# Patient Record
Sex: Male | Born: 1995 | Race: Black or African American | Hispanic: No | Marital: Single | Smoking: Never smoker
Health system: Southern US, Community
[De-identification: ages and names within clinical notes are randomized; demographics above are authoritative.]

## PROBLEM LIST (undated history)

## (undated) DIAGNOSIS — K56609 Unspecified intestinal obstruction, unspecified as to partial versus complete obstruction: Secondary | ICD-10-CM

---

## 2020-05-12 ENCOUNTER — Other Ambulatory Visit: Payer: Self-pay

## 2020-05-12 ENCOUNTER — Inpatient Hospital Stay (HOSPITAL_COMMUNITY)
Admission: EM | Admit: 2020-05-12 | Discharge: 2020-05-18 | DRG: 329 | Disposition: A | Discharge status: Home / Self Care | Payer: Medicaid Other | Attending: Surgery | Admitting: Surgery

## 2020-05-12 DIAGNOSIS — D62 Acute posthemorrhagic anemia: Secondary | ICD-10-CM | POA: Diagnosis not present

## 2020-05-12 DIAGNOSIS — Z20822 Contact with and (suspected) exposure to covid-19: Secondary | ICD-10-CM | POA: Diagnosis present

## 2020-05-12 DIAGNOSIS — K55049 Acute infarction of large intestine, extent unspecified: Secondary | ICD-10-CM | POA: Diagnosis present

## 2020-05-12 DIAGNOSIS — K562 Volvulus: Principal | ICD-10-CM | POA: Diagnosis present

## 2020-05-12 DIAGNOSIS — D72829 Elevated white blood cell count, unspecified: Secondary | ICD-10-CM | POA: Diagnosis not present

## 2020-05-12 DIAGNOSIS — I96 Gangrene, not elsewhere classified: Secondary | ICD-10-CM | POA: Diagnosis present

## 2020-05-12 DIAGNOSIS — K559 Vascular disorder of intestine, unspecified: Secondary | ICD-10-CM | POA: Diagnosis present

## 2020-05-12 DIAGNOSIS — R509 Fever, unspecified: Secondary | ICD-10-CM

## 2020-05-12 DIAGNOSIS — Z9889 Other specified postprocedural states: Secondary | ICD-10-CM

## 2020-05-12 LAB — CBC
HCT: 50.7 % (ref 39.0–52.0)
Hemoglobin: 17.1 g/dL — ABNORMAL HIGH (ref 13.0–17.0)
MCH: 29.8 pg (ref 26.0–34.0)
MCHC: 33.7 g/dL (ref 30.0–36.0)
MCV: 88.5 fL (ref 80.0–100.0)
Platelets: 209 10*3/uL (ref 150–400)
RBC: 5.73 MIL/uL (ref 4.22–5.81)
RDW: 13.5 % (ref 11.5–15.5)
WBC: 18.7 10*3/uL — ABNORMAL HIGH (ref 4.0–10.5)
nRBC: 0 % (ref 0.0–0.2)

## 2020-05-12 LAB — COMPREHENSIVE METABOLIC PANEL
ALT: 22 U/L (ref 0–44)
AST: 26 U/L (ref 15–41)
Albumin: 4.4 g/dL (ref 3.5–5.0)
Alkaline Phosphatase: 54 U/L (ref 38–126)
Anion gap: 15 (ref 5–15)
BUN: 16 mg/dL (ref 6–20)
CO2: 20 mmol/L — ABNORMAL LOW (ref 22–32)
Calcium: 9.7 mg/dL (ref 8.9–10.3)
Chloride: 101 mmol/L (ref 98–111)
Creatinine, Ser: 1.18 mg/dL (ref 0.61–1.24)
GFR, Estimated: >60 mL/min (ref >60)
Glucose, Bld: 161 mg/dL — ABNORMAL HIGH (ref 70–99)
Potassium: 3.6 mmol/L (ref 3.5–5.1)
Sodium: 136 mmol/L (ref 135–145)
Total Bilirubin: 0.8 mg/dL (ref 0.3–1.2)
Total Protein: 7.7 g/dL (ref 6.5–8.1)

## 2020-05-12 LAB — LIPASE, BLOOD: Lipase: 17 U/L (ref 11–51)

## 2020-05-12 NOTE — ED Triage Notes (Addendum)
Pt c/o abdominal pain that started today. Also c/o N/V, and c/o blood with bowel movement  - states when he wipes only (bright red).

## 2020-05-12 NOTE — ED Notes (Signed)
Pt was lying in the floor in the lobby so the tech brought pt in for reassessment. Pt is A&O x4, continues to rock back and forth and c/o pain. Tech in lobby states pt is hypotensive. Re-evaluating vital signs.

## 2020-05-13 ENCOUNTER — Emergency Department (HOSPITAL_COMMUNITY): Payer: Medicaid Other | Admitting: Registered Nurse

## 2020-05-13 ENCOUNTER — Encounter (HOSPITAL_COMMUNITY): Admission: EM | Disposition: A | Discharge status: Home / Self Care | Payer: Self-pay | Source: Home / Self Care

## 2020-05-13 ENCOUNTER — Emergency Department (HOSPITAL_COMMUNITY): Payer: Medicaid Other

## 2020-05-13 DIAGNOSIS — D72829 Elevated white blood cell count, unspecified: Secondary | ICD-10-CM | POA: Diagnosis not present

## 2020-05-13 DIAGNOSIS — K562 Volvulus: Secondary | ICD-10-CM | POA: Diagnosis present

## 2020-05-13 DIAGNOSIS — K559 Vascular disorder of intestine, unspecified: Secondary | ICD-10-CM | POA: Diagnosis not present

## 2020-05-13 DIAGNOSIS — K55049 Acute infarction of large intestine, extent unspecified: Secondary | ICD-10-CM | POA: Diagnosis present

## 2020-05-13 DIAGNOSIS — Z20822 Contact with and (suspected) exposure to covid-19: Secondary | ICD-10-CM | POA: Diagnosis present

## 2020-05-13 DIAGNOSIS — Z9889 Other specified postprocedural states: Secondary | ICD-10-CM

## 2020-05-13 DIAGNOSIS — I96 Gangrene, not elsewhere classified: Secondary | ICD-10-CM | POA: Diagnosis not present

## 2020-05-13 DIAGNOSIS — D62 Acute posthemorrhagic anemia: Secondary | ICD-10-CM | POA: Diagnosis not present

## 2020-05-13 HISTORY — PX: OSTOMY: SHX5997

## 2020-05-13 HISTORY — PX: LAPAROTOMY: SHX154

## 2020-05-13 HISTORY — PX: COLON RESECTION SIGMOID: SHX6737

## 2020-05-13 LAB — BASIC METABOLIC PANEL
Anion gap: 14 (ref 5–15)
Anion gap: 10 (ref 5–15)
BUN: 18 mg/dL (ref 6–20)
BUN: 19 mg/dL (ref 6–20)
CO2: 15 mmol/L — ABNORMAL LOW (ref 22–32)
CO2: 18 mmol/L — ABNORMAL LOW (ref 22–32)
Calcium: 8.8 mg/dL — ABNORMAL LOW (ref 8.9–10.3)
Calcium: 8.1 mg/dL — ABNORMAL LOW (ref 8.9–10.3)
Chloride: 104 mmol/L (ref 98–111)
Chloride: 106 mmol/L (ref 98–111)
Creatinine, Ser: 1.35 mg/dL — ABNORMAL HIGH (ref 0.61–1.24)
Creatinine, Ser: 1.13 mg/dL (ref 0.61–1.24)
GFR, Estimated: >60 mL/min (ref >60)
GFR, Estimated: >60 mL/min (ref >60)
Glucose, Bld: 161 mg/dL — ABNORMAL HIGH (ref 70–99)
Glucose, Bld: 160 mg/dL — ABNORMAL HIGH (ref 70–99)
Potassium: 6.2 mmol/L — ABNORMAL HIGH (ref 3.5–5.1)
Potassium: 4.5 mmol/L (ref 3.5–5.1)
Sodium: 133 mmol/L — ABNORMAL LOW (ref 135–145)
Sodium: 134 mmol/L — ABNORMAL LOW (ref 135–145)

## 2020-05-13 LAB — CBC
HCT: 50.3 % (ref 39.0–52.0)
HCT: 45.2 % (ref 39.0–52.0)
Hemoglobin: 16.8 g/dL (ref 13.0–17.0)
Hemoglobin: 15.9 g/dL (ref 13.0–17.0)
MCH: 29.5 pg (ref 26.0–34.0)
MCH: 30.5 pg (ref 26.0–34.0)
MCHC: 33.4 g/dL (ref 30.0–36.0)
MCHC: 35.2 g/dL (ref 30.0–36.0)
MCV: 88.4 fL (ref 80.0–100.0)
MCV: 86.6 fL (ref 80.0–100.0)
Platelets: 197 10*3/uL (ref 150–400)
Platelets: 177 10*3/uL (ref 150–400)
RBC: 5.69 MIL/uL (ref 4.22–5.81)
RBC: 5.22 MIL/uL (ref 4.22–5.81)
RDW: 13.7 % (ref 11.5–15.5)
RDW: 13.8 % (ref 11.5–15.5)
WBC: 20.4 10*3/uL — ABNORMAL HIGH (ref 4.0–10.5)
WBC: 18.2 10*3/uL — ABNORMAL HIGH (ref 4.0–10.5)
nRBC: 0 % (ref 0.0–0.2)
nRBC: 0 % (ref 0.0–0.2)

## 2020-05-13 LAB — URINALYSIS, ROUTINE W REFLEX MICROSCOPIC
Glucose, UA: NEGATIVE mg/dL
Hgb urine dipstick: NEGATIVE
Ketones, ur: 5 mg/dL — AB
Nitrite: NEGATIVE
Protein, ur: 30 mg/dL — AB
Specific Gravity, Urine: 1.032 — ABNORMAL HIGH (ref 1.005–1.030)
pH: 5 (ref 5.0–8.0)

## 2020-05-13 LAB — MAGNESIUM
Magnesium: 1.8 mg/dL (ref 1.7–2.4)
Magnesium: 1.6 mg/dL — ABNORMAL LOW (ref 1.7–2.4)

## 2020-05-13 LAB — PHOSPHORUS
Phosphorus: 3.9 mg/dL (ref 2.5–4.6)
Phosphorus: 4.4 mg/dL (ref 2.5–4.6)

## 2020-05-13 LAB — RESP PANEL BY RT-PCR (FLU A&B, COVID) ARPGX2
Influenza A by PCR: NEGATIVE
Influenza B by PCR: NEGATIVE
SARS Coronavirus 2 by RT PCR: NEGATIVE

## 2020-05-13 LAB — HIV ANTIBODY (ROUTINE TESTING W REFLEX): HIV Screen 4th Generation wRfx: NONREACTIVE

## 2020-05-13 SURGERY — OSTOMY
Anesthesia: General | Site: Abdomen

## 2020-05-13 MED ORDER — ENOXAPARIN SODIUM 40 MG/0.4ML ~~LOC~~ SOLN
40.0000 mg | SUBCUTANEOUS | Status: DC
  Administered 2020-05-14 – 2020-05-18 (×5): 40 mg via SUBCUTANEOUS
  Filled 2020-05-13 (×6): qty 0.4

## 2020-05-13 MED ORDER — ACETAMINOPHEN 10 MG/ML IV SOLN
1000.0000 mg | Freq: Four times a day (QID) | INTRAVENOUS | Status: AC
  Administered 2020-05-13 (×3): 1000 mg via INTRAVENOUS
  Filled 2020-05-13 (×4): qty 100

## 2020-05-13 MED ORDER — PHENYLEPHRINE 40 MCG/ML (10ML) SYRINGE FOR IV PUSH (FOR BLOOD PRESSURE SUPPORT)
PREFILLED_SYRINGE | INTRAVENOUS | Status: DC | PRN
  Administered 2020-05-13 (×9): 80 ug via INTRAVENOUS

## 2020-05-13 MED ORDER — KETOROLAC TROMETHAMINE 30 MG/ML IJ SOLN
INTRAMUSCULAR | Status: AC
  Administered 2020-05-13: 30 mg
  Filled 2020-05-13: qty 1

## 2020-05-13 MED ORDER — HYDROMORPHONE HCL 1 MG/ML IJ SOLN
0.5000 mg | Freq: Once | INTRAMUSCULAR | Status: AC
  Administered 2020-05-13: 0.5 mg via INTRAVENOUS
  Filled 2020-05-13: qty 1

## 2020-05-13 MED ORDER — FENTANYL CITRATE (PF) 250 MCG/5ML IJ SOLN
INTRAMUSCULAR | Status: AC
  Filled 2020-05-13: qty 5

## 2020-05-13 MED ORDER — METHOCARBAMOL 1000 MG/10ML IJ SOLN
1000.0000 mg | Freq: Three times a day (TID) | INTRAVENOUS | Status: DC
  Administered 2020-05-13 – 2020-05-18 (×14): 1000 mg via INTRAVENOUS
  Filled 2020-05-13 (×17): qty 10

## 2020-05-13 MED ORDER — MEPERIDINE HCL 25 MG/ML IJ SOLN: 6.2500 mg | INTRAMUSCULAR | Status: DC | PRN

## 2020-05-13 MED ORDER — MIDAZOLAM HCL 2 MG/2ML IJ SOLN
INTRAMUSCULAR | Status: AC
  Filled 2020-05-13: qty 2

## 2020-05-13 MED ORDER — EPHEDRINE SULFATE-NACL 50-0.9 MG/10ML-% IV SOSY
PREFILLED_SYRINGE | INTRAVENOUS | Status: DC | PRN
  Administered 2020-05-13 (×2): 5 mg via INTRAVENOUS

## 2020-05-13 MED ORDER — CEFAZOLIN SODIUM-DEXTROSE 2-3 GM-%(50ML) IV SOLR
INTRAVENOUS | Status: DC | PRN
  Administered 2020-05-13: 2 g via INTRAVENOUS

## 2020-05-13 MED ORDER — POTASSIUM CHLORIDE 10 MEQ/100ML IV SOLN
INTRAVENOUS | Status: AC
  Filled 2020-05-13: qty 200

## 2020-05-13 MED ORDER — SODIUM CHLORIDE 0.9 % IV SOLN
1000.0000 mL | INTRAVENOUS | Status: DC
  Administered 2020-05-13: 1000 mL via INTRAVENOUS

## 2020-05-13 MED ORDER — ACETAMINOPHEN 10 MG/ML IV SOLN
INTRAVENOUS | Status: AC
  Administered 2020-05-13: 1000 mg via INTRAVENOUS
  Filled 2020-05-13: qty 100

## 2020-05-13 MED ORDER — LACTATED RINGERS IV SOLN: INTRAVENOUS | Status: DC | PRN

## 2020-05-13 MED ORDER — DOCUSATE SODIUM 100 MG PO CAPS
100.0000 mg | ORAL_CAPSULE | Freq: Two times a day (BID) | ORAL | Status: DC
  Administered 2020-05-15 – 2020-05-18 (×6): 100 mg via ORAL
  Filled 2020-05-13 (×8): qty 1

## 2020-05-13 MED ORDER — LIDOCAINE 2% (20 MG/ML) 5 ML SYRINGE
INTRAMUSCULAR | Status: DC | PRN
  Administered 2020-05-13: 100 mg via INTRAVENOUS

## 2020-05-13 MED ORDER — MIDAZOLAM HCL 5 MG/5ML IJ SOLN
INTRAMUSCULAR | Status: DC | PRN
  Administered 2020-05-13: 2 mg via INTRAVENOUS

## 2020-05-13 MED ORDER — PIPERACILLIN-TAZOBACTAM 3.375 G IVPB 30 MIN
3.3750 g | Freq: Once | INTRAVENOUS | Status: AC
  Administered 2020-05-13: 3.375 g via INTRAVENOUS

## 2020-05-13 MED ORDER — HYDROMORPHONE HCL 1 MG/ML IJ SOLN
0.2500 mg | INTRAMUSCULAR | Status: DC | PRN
  Administered 2020-05-13: 0.5 mg via INTRAVENOUS

## 2020-05-13 MED ORDER — LACTATED RINGERS IV SOLN: INTRAVENOUS | Status: DC

## 2020-05-13 MED ORDER — POTASSIUM CHLORIDE 10 MEQ/100ML IV SOLN
10.0000 meq | INTRAVENOUS | Status: AC
  Administered 2020-05-13 (×4): 10 meq via INTRAVENOUS
  Filled 2020-05-13: qty 100

## 2020-05-13 MED ORDER — ONDANSETRON HCL 4 MG/2ML IJ SOLN
INTRAMUSCULAR | Status: DC | PRN
  Administered 2020-05-13: 4 mg via INTRAVENOUS

## 2020-05-13 MED ORDER — PHENYLEPHRINE HCL-NACL 10-0.9 MG/250ML-% IV SOLN
INTRAVENOUS | Status: DC | PRN
  Administered 2020-05-13: 25 ug/min via INTRAVENOUS

## 2020-05-13 MED ORDER — ONDANSETRON HCL 4 MG/2ML IJ SOLN
4.0000 mg | Freq: Once | INTRAMUSCULAR | Status: AC
  Administered 2020-05-13: 4 mg via INTRAVENOUS
  Filled 2020-05-13: qty 2

## 2020-05-13 MED ORDER — ONDANSETRON 4 MG PO TBDP: 4.0000 mg | ORAL_TABLET | Freq: Four times a day (QID) | ORAL | Status: DC | PRN

## 2020-05-13 MED ORDER — SODIUM CHLORIDE 0.9 % IV BOLUS (SEPSIS)
1000.0000 mL | Freq: Once | INTRAVENOUS | Status: AC
  Administered 2020-05-13: 1000 mL via INTRAVENOUS

## 2020-05-13 MED ORDER — PROPOFOL 10 MG/ML IV BOLUS
INTRAVENOUS | Status: AC
  Filled 2020-05-13: qty 20

## 2020-05-13 MED ORDER — PIPERACILLIN-TAZOBACTAM 3.375 G IVPB 30 MIN
3.3750 g | Freq: Three times a day (TID) | INTRAVENOUS | Status: AC
  Administered 2020-05-13 (×2): 3.375 g via INTRAVENOUS
  Filled 2020-05-13 (×4): qty 50

## 2020-05-13 MED ORDER — ONDANSETRON HCL 4 MG/2ML IJ SOLN: 4.0000 mg | Freq: Once | INTRAMUSCULAR | Status: DC | PRN

## 2020-05-13 MED ORDER — SUGAMMADEX SODIUM 200 MG/2ML IV SOLN
INTRAVENOUS | Status: DC | PRN
  Administered 2020-05-13: 200 mg via INTRAVENOUS

## 2020-05-13 MED ORDER — FENTANYL CITRATE (PF) 250 MCG/5ML IJ SOLN
INTRAMUSCULAR | Status: DC | PRN
  Administered 2020-05-13: 250 ug via INTRAVENOUS
  Administered 2020-05-13 (×2): 25 ug via INTRAVENOUS

## 2020-05-13 MED ORDER — 0.9 % SODIUM CHLORIDE (POUR BTL) OPTIME
TOPICAL | Status: DC | PRN
  Administered 2020-05-13: 5000 mL

## 2020-05-13 MED ORDER — POTASSIUM CHLORIDE 10 MEQ/100ML IV SOLN
INTRAVENOUS | Status: AC
  Filled 2020-05-13: qty 100

## 2020-05-13 MED ORDER — MORPHINE SULFATE (PF) 2 MG/ML IV SOLN: 2.0000 mg | INTRAVENOUS | Status: DC | PRN

## 2020-05-13 MED ORDER — IOHEXOL 300 MG/ML  SOLN
100.0000 mL | Freq: Once | INTRAMUSCULAR | Status: AC | PRN
  Administered 2020-05-13: 100 mL via INTRAVENOUS

## 2020-05-13 MED ORDER — HYDROMORPHONE HCL 1 MG/ML IJ SOLN
INTRAMUSCULAR | Status: AC
  Filled 2020-05-13: qty 1

## 2020-05-13 MED ORDER — ONDANSETRON HCL 4 MG/2ML IJ SOLN
4.0000 mg | Freq: Four times a day (QID) | INTRAMUSCULAR | Status: DC | PRN
  Administered 2020-05-13: 4 mg via INTRAVENOUS
  Filled 2020-05-13: qty 2

## 2020-05-13 MED ORDER — OXYCODONE HCL 5 MG/5ML PO SOLN
5.0000 mg | ORAL | Status: DC | PRN
  Administered 2020-05-16: 5 mg via ORAL
  Administered 2020-05-17: 10 mg via ORAL
  Filled 2020-05-13: qty 5
  Filled 2020-05-13: qty 10

## 2020-05-13 MED ORDER — KETOROLAC TROMETHAMINE 15 MG/ML IJ SOLN
30.0000 mg | Freq: Four times a day (QID) | INTRAMUSCULAR | Status: AC
  Administered 2020-05-13 – 2020-05-18 (×19): 30 mg via INTRAVENOUS
  Filled 2020-05-13 (×19): qty 2

## 2020-05-13 MED ORDER — PROPOFOL 10 MG/ML IV BOLUS
INTRAVENOUS | Status: DC | PRN
  Administered 2020-05-13: 200 mg via INTRAVENOUS

## 2020-05-13 MED ORDER — SUCCINYLCHOLINE CHLORIDE 200 MG/10ML IV SOSY
PREFILLED_SYRINGE | INTRAVENOUS | Status: DC | PRN
  Administered 2020-05-13: 140 mg via INTRAVENOUS

## 2020-05-13 MED ORDER — ROCURONIUM BROMIDE 10 MG/ML (PF) SYRINGE
PREFILLED_SYRINGE | INTRAVENOUS | Status: DC | PRN
  Administered 2020-05-13: 60 mg via INTRAVENOUS

## 2020-05-13 SURGICAL SUPPLY — 52 items
BLADE CLIPPER SURG (BLADE) IMPLANT
CANISTER SUCT 3000ML PPV (MISCELLANEOUS) ×2 IMPLANT
CHLORAPREP W/TINT 26 (MISCELLANEOUS) ×2 IMPLANT
COVER SURGICAL LIGHT HANDLE (MISCELLANEOUS) ×2 IMPLANT
DRAPE LAPAROSCOPIC ABDOMINAL (DRAPES) ×2 IMPLANT
DRAPE UNIVERSAL (DRAPES) ×2 IMPLANT
DRAPE WARM FLUID 44X44 (DRAPES) ×2 IMPLANT
DRSG OPSITE 11X17.75 LRG (GAUZE/BANDAGES/DRESSINGS) ×2 IMPLANT
DRSG OPSITE POSTOP 4X10 (GAUZE/BANDAGES/DRESSINGS) ×2 IMPLANT
DRSG OPSITE POSTOP 4X8 (GAUZE/BANDAGES/DRESSINGS) ×2 IMPLANT
ELECT BLADE 6.5 EXT (BLADE) IMPLANT
ELECT CAUTERY BLADE 6.4 (BLADE) ×2 IMPLANT
ELECT REM PT RETURN 9FT ADLT (ELECTROSURGICAL) ×2
ELECTRODE REM PT RTRN 9FT ADLT (ELECTROSURGICAL) ×1 IMPLANT
GAUZE SPONGE 4X4 12PLY STRL LF (GAUZE/BANDAGES/DRESSINGS) ×2 IMPLANT
GLOVE BIO SURGEON STRL SZ 6.5 (GLOVE) ×4 IMPLANT
GLOVE BIOGEL PI IND STRL 6 (GLOVE) ×2 IMPLANT
GLOVE BIOGEL PI IND STRL 7.0 (GLOVE) ×2 IMPLANT
GLOVE BIOGEL PI IND STRL 7.5 (GLOVE) ×2 IMPLANT
GLOVE BIOGEL PI INDICATOR 6 (GLOVE) ×2
GLOVE BIOGEL PI INDICATOR 7.0 (GLOVE) ×2
GLOVE BIOGEL PI INDICATOR 7.5 (GLOVE) ×2
GOWN STRL REUS W/ TWL LRG LVL3 (GOWN DISPOSABLE) ×2 IMPLANT
GOWN STRL REUS W/TWL LRG LVL3 (GOWN DISPOSABLE) ×4
HANDLE SUCTION POOLE (INSTRUMENTS) ×1 IMPLANT
KIT BASIN OR (CUSTOM PROCEDURE TRAY) ×2 IMPLANT
KIT OSTOMY DRAINABLE 2.75 STR (WOUND CARE) ×2 IMPLANT
KIT TURNOVER KIT B (KITS) ×2 IMPLANT
LIGASURE IMPACT 36 18CM CVD LR (INSTRUMENTS) ×2 IMPLANT
NS IRRIG 1000ML POUR BTL (IV SOLUTION) ×10 IMPLANT
PACK GENERAL/GYN (CUSTOM PROCEDURE TRAY) ×2 IMPLANT
PAD ABD 8X10 STRL (GAUZE/BANDAGES/DRESSINGS) ×2 IMPLANT
PAD ARMBOARD 7.5X6 YLW CONV (MISCELLANEOUS) ×2 IMPLANT
PENCIL SMOKE EVACUATOR (MISCELLANEOUS) ×2 IMPLANT
RELOAD PROXIMATE 75MM BLUE (ENDOMECHANICALS) ×4 IMPLANT
SPONGE LAP 18X18 RF (DISPOSABLE) ×4 IMPLANT
STAPLER PROXIMATE 75MM BLUE (STAPLE) ×2 IMPLANT
STAPLER VISISTAT 35W (STAPLE) ×2 IMPLANT
SUCTION POOLE HANDLE (INSTRUMENTS) ×2
SUT PDS AB 1 TP1 54 (SUTURE) ×4 IMPLANT
SUT PDS AB 1 TP1 96 (SUTURE) ×4 IMPLANT
SUT PROLENE 2 0 SH 30 (SUTURE) ×2 IMPLANT
SUT SILK 2 0 SH CR/8 (SUTURE) ×2 IMPLANT
SUT SILK 2 0 TIES 10X30 (SUTURE) ×2 IMPLANT
SUT SILK 3 0 SH CR/8 (SUTURE) ×2 IMPLANT
SUT SILK 3 0 TIES 10X30 (SUTURE) ×2 IMPLANT
SUT VIC AB 2-0 SH 18 (SUTURE) ×6 IMPLANT
SUT VIC AB 3-0 SH 18 (SUTURE) ×2 IMPLANT
TAPE CLOTH SURG 4X10 WHT LF (GAUZE/BANDAGES/DRESSINGS) ×2 IMPLANT
TOWEL GREEN STERILE (TOWEL DISPOSABLE) ×2 IMPLANT
TRAY FOLEY MTR SLVR 16FR STAT (SET/KITS/TRAYS/PACK) ×2 IMPLANT
YANKAUER SUCT BULB TIP NO VENT (SUCTIONS) IMPLANT

## 2020-05-13 NOTE — Evaluation (Signed)
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 Pt underwent emergent ex-lap, bowel resection, and ostomy. PMH: pt and family refugee from [place], have been here a month.  Clinical Impression  Pt admitted with above. Pt very pleasant and denies pain, however per RN pt has been given pain medicine t/o day as ordered. Pt functioning at min guard with exception of assist with trunk elevation to transfer to EOB. Pt with good home set up and support. Pt did become diaphoretic with tachycardia, HR into 120s during ambulation. Acute PT to follow/monitor while pt in hospital.     Follow Up Recommendations No PT follow up;Supervision - Intermittent    Equipment Recommendations  None recommended by PT    Recommendations for Other Services       Precautions / Restrictions Precautions Precautions: Other (comment) Precaution Comments: ostomy, abdominal incision Restrictions Weight Bearing Restrictions: No      Mobility  Bed Mobility Overal bed mobility: Needs Assistance Bed Mobility: Rolling;Sidelying to Sit Rolling: Min guard Sidelying to sit: Min assist       General bed mobility comments: max directional verbal cues for log roll technique, min A for trunk elevation, HOB elevated    Transfers Overall transfer level: Needs assistance Equipment used: None Transfers: Sit to/from Stand Sit to Stand: Min guard         General transfer comment: increased time, slow and guarded but steady and safe  Ambulation/Gait Ambulation/Gait assistance: Min guard Gait Distance (Feet): 150 Feet Assistive device: None Gait Pattern/deviations: Step-through pattern;Decreased stride length Gait velocity: dec compared to normal Gait velocity interpretation: 1.31 - 2.62 ft/sec, indicative of limited  community ambulator General Gait Details: no episode of LOB, slow and guarded but steady, able to stand up tall and straight  Careers information officer    Modified Rankin (Stroke Patients Only)       Balance Overall balance assessment: Mild deficits observed, not formally tested                                           Pertinent Vitals/Pain Pain Assessment: No/denies pain    Home Living Family/patient expects to be discharged to:: Private residence Living Arrangements: Parent;Children Available Help at Discharge: Family;Available 24 hours/day Type of Home: House Home Access: Level entry     Home Layout: One level Home Equipment: None Additional Comments: pt, parents and 6 siblings are in refugee program from [place]. Have been here for 1 month. Live in duplex - 1 for boys and 1 for girls.    Prior Function Level of Independence: Independent               Hand Dominance   Dominant Hand: Right    Extremity/Trunk Assessment   Upper Extremity Assessment Upper Extremity Assessment: Overall WFL for tasks assessed    Lower Extremity Assessment Lower Extremity Assessment: Overall WFL for tasks assessed    Cervical / Trunk Assessment Cervical / Trunk Assessment: Other exceptions Cervical / Trunk Exceptions: recent exlap and ostomy  Communication   Communication: Prefers language other than English (pt speaks arabic)  Cognition Arousal/Alertness: Awake/alert Behavior During Therapy: WFL for tasks assessed/performed Overall Cognitive Status:  Within Functional Limits for tasks assessed                                        General Comments General comments (skin integrity, edema, etc.): upon PT arrival pt with bowel movement. Pt able to sit at EOB and don socks via bringing ankle up to knee, increased time due to abdominal discomfort. Pt stood x 4 min and assisted with pericare. No episode of LOB    Exercises      Assessment/Plan    PT Assessment Patient needs continued PT services  PT Problem List Decreased strength;Decreased range of motion;Decreased activity tolerance;Decreased mobility;Decreased balance;Decreased coordination;Decreased cognition;Decreased knowledge of use of DME;Decreased safety awareness       PT Treatment Interventions DME instruction;Gait training;Stair training;Functional mobility training;Therapeutic activities;Therapeutic exercise;Balance training    PT Goals (Current goals can be found in the Care Plan section)  Acute Rehab PT Goals PT Goal Formulation: With patient Time For Goal Achievement: 05/26/20 Potential to Achieve Goals: Good    Frequency Min 3X/week   Barriers to discharge        Co-evaluation               AM-PAC PT "6 Clicks" Mobility  Outcome Measure Help needed turning from your back to your side while in a flat bed without using bedrails?: A Little Help needed moving from lying on your back to sitting on the side of a flat bed without using bedrails?: A Little Help needed moving to and from a bed to a chair (including a wheelchair)?: A Little Help needed standing up from a chair using your arms (e.g., wheelchair or bedside chair)?: A Little Help needed to walk in hospital room?: A Little Help needed climbing 3-5 steps with a railing? : A Little 6 Click Score: 18    End of Session   Activity Tolerance: Patient tolerated treatment well Patient left: in chair;with call bell/phone within reach;with nursing/sitter in room Nurse Communication: Mobility status PT Visit Diagnosis: Unsteadiness on feet (R26.81)    Time: 8676-7209 PT Time Calculation (min) (ACUTE ONLY): 27 min   Charges:   PT Evaluation $PT Eval Moderate Complexity: 1 Mod PT Treatments $Gait Training: 8-22 mins        Lewis Shock, PT, DPT Acute Rehabilitation Services Pager #: [phone] Office #: [phone]   Iona Hansen 05/13/2020, 2:01 PM

## 2020-05-13 NOTE — ED Notes (Signed)
Pt to go to OR2; advised not to put in NGT before transport; pt transported via stretcher; report given to OR staff at bedside.

## 2020-05-13 NOTE — Progress Notes (Signed)
OT Cancellation Note  Patient Details Name: Todd Jordan MRN: [id number] DOB: 03/27/1996   Cancelled Treatment:    Reason Eval/Treat Not Completed: Fatigue/lethargy limiting ability to participate; pt sleeping soundly upon OT arrival. Will attempt to follow up for OT eval as schedule permits.  Marcy Siren, OT Acute Rehabilitation Services Pager [phone] Office [phone]   Todd Jordan 05/13/2020, 4:36 PM
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
## 2020-05-13 NOTE — Anesthesia Postprocedure Evaluation (Signed)
Anesthesia Post Note  Patient: Todd Jordan  Procedure(s) Performed: EXPLORATORY LAPAROTOMY MOTION OF SPLENIC FLEXTION (N/A Abdomen) OSTOMY (N/A Abdomen) COLON RESECTION SIGMOID (N/A Abdomen)     Patient location during evaluation: PACU Anesthesia Type: General Level of consciousness: awake and alert Pain management: pain level controlled Vital Signs Assessment: post-procedure vital signs reviewed and stable Respiratory status: spontaneous breathing, nonlabored ventilation, respiratory function stable and patient connected to nasal cannula oxygen Cardiovascular status: blood pressure returned to baseline and stable Postop Assessment: no apparent nausea or vomiting Anesthetic complications: no   No complications documented.  Last Vitals:  Vitals:   05/13/20 0420 05/13/20 0435  BP: (!) 158/102 (!) 163/101  Pulse:    Resp: 15 14  Temp: 36.4 C   SpO2: 97%     Last Pain:  Vitals:   05/13/20 0435  TempSrc:   PainSc: 3                  Latha Staunton DAVID

## 2020-05-13 NOTE — Anesthesia Preprocedure Evaluation (Signed)
Anesthesia Evaluation  Patient identified by MRN, date of birth, ID band Patient awake    Reviewed: Allergy & Precautions, NPO status , Patient's Chart, lab work & pertinent test results  Airway Mallampati: I  TM Distance: >3 FB Neck ROM: Full    Dental   Pulmonary    Pulmonary exam normal        Cardiovascular Normal cardiovascular exam     Neuro/Psych    GI/Hepatic   Endo/Other    Renal/GU      Musculoskeletal   Abdominal   Peds  Hematology   Anesthesia Other Findings   Reproductive/Obstetrics                             Anesthesia Physical Anesthesia Plan  ASA: III and emergent  Anesthesia Plan: General   Post-op Pain Management:    Induction: Intravenous, Rapid sequence and Cricoid pressure planned  PONV Risk Score and Plan: 2 and Ondansetron and Midazolam  Airway Management Planned: Oral ETT  Additional Equipment:   Intra-op Plan:   Post-operative Plan: Possible Post-op intubation/ventilation  Informed Consent: I have reviewed the patients History and Physical, chart, labs and discussed the procedure including the risks, benefits and alternatives for the proposed anesthesia with the patient or authorized representative who has indicated his/her understanding and acceptance.       Plan Discussed with: CRNA and Surgeon  Anesthesia Plan Comments:         Anesthesia Quick Evaluation

## 2020-05-13 NOTE — Progress Notes (Signed)
Patient is diaphoretic. Patient complaining of dizziness and nausea. Zofran given. Vital signs within normal limits except heart rate sustaining in the 120s. Dr. Fredricka Bonine made aware and instructed RN to continue to monitor patient.    .BP 125/79 (BP Location: Left Arm)   Pulse (!) 105   Temp 99.7 F (37.6 C) (Oral)   Resp 16   Ht 6' 1.62" (1.87 m)   Wt 74.8 kg   SpO2 97%   BMI 21.39 kg/m

## 2020-05-13 NOTE — Consult Note (Signed)
WOC Nurse ostomy follow up Patient receiving care in [facility] 4NP5.  Teleinterpreter Asmaa, Operator [phone], used throughout the encounter. Stoma type/location: LUQ colostomy Stomal assessment/size: 1 inch round, slightly budded, red, moist Peristomal assessment: deferred. The ostomy appliance is placed over the surgical dressing Treatment options for stomal/peristomal skin: barrier ring if needed Output: drops of serosanginous in pouch  Ostomy pouching: 2pc. Placed in OR Education provided: Via interpreter, I explained the WOC role to the patient's father.  And, that I will return tomorrow with teaching materials in [place].  The father tells me he can read some English and can translate words he does not recognize.  The patient remained asleep during my conversation with the father. Enrolled patient in [place] Secure Start Discharge program: No Supplies ordered: [employer]in #[id number]; barrier ring, [facility] [place] [phone]. Helmut Muster, RN, MSN, CWOCN, CNS-BC, pager [phone]

## 2020-05-13 NOTE — Progress Notes (Signed)
Received report from [place] and pt was transported to floor.  Patient was accompanied by father and translator was used to explain rules and policy of the floor.  Patient was AxO=4 and reporting no pain at this time.  Patient had NG tube hooked up to low intermittent suction.  Call bell was placed and bed left in lowest position.

## 2020-05-13 NOTE — Progress Notes (Signed)
   05/13/20 1610  Assess: MEWS Score  Temp 97.9 F (36.6 C)  BP 121/85  Pulse Rate (!) 114  ECG Heart Rate (!) 118  Resp 15  SpO2 98 %  O2 Device Room Air  Assess: MEWS Score  MEWS Temp 0  MEWS Systolic 0  MEWS Pulse 2  MEWS RR 0  MEWS LOC 0  MEWS Score 2  MEWS Score Color Yellow  Assess: if the MEWS score is Yellow or Red  Were vital signs taken at a resting state? Yes  Focused Assessment No change from prior assessment  Early Detection of Sepsis Score *See Row Information* Low  MEWS guidelines implemented *See Row Information* Yes  Treat  Pain Scale PAINAD  Pain Score 0  Take Vital Signs  Increase Vital Sign Frequency  Yellow: Q 2hr X 2 then Q 4hr X 2, if remains yellow, continue Q 4hrs  Escalate  MEWS: Escalate Yellow: discuss with charge nurse/RN and consider discussing with provider and RRT  Notify: Charge Nurse/RN  Name of Charge Nurse/RN Notified Moriah, RN  Date Charge Nurse/RN Notified 05/13/20  Time Charge Nurse/RN Notified [phone]  Document  Patient Outcome Other (Comment) (no intervention needed)

## 2020-05-13 NOTE — Transfer of Care (Signed)
Immediate Anesthesia Transfer of Care Note  Patient: Todd Jordan  Procedure(s) Performed: EXPLORATORY LAPAROTOMY MOTION OF SPLENIC FLEXTION (N/A Abdomen) OSTOMY (N/A Abdomen) COLON RESECTION SIGMOID (N/A Abdomen)  Patient Location: PACU  Anesthesia Type:General  Level of Consciousness: drowsy and patient cooperative  Airway & Oxygen Therapy: Patient Spontanous Breathing and Patient connected to face mask oxygen  Post-op Assessment: Report given to RN and Post -op Vital signs reviewed and stable  Post vital signs: Reviewed and stable  Last Vitals:  Vitals Value Taken Time  BP 157/86 05/13/20 0422  Temp    Pulse 109   Resp 16 05/13/20 0422  SpO2 100   Vitals shown include unvalidated device data.  Last Pain:  Vitals:   05/12/20 2242  TempSrc: Oral  PainSc: 10-Worst pain ever         Complications: No complications documented.

## 2020-05-13 NOTE — ED Provider Notes (Addendum)
[facility] EMERGENCY DEPARTMENT Provider Note  CSN: [id number] Arrival date & time: 05/12/20 2020  Chief Complaint(s) Abdominal Pain  HPI Todd Jordan is a 24 y.o. male   HPI The history is limited by a language barrier. A language interpreter was used.  Abdominal Pain Pain location:  Periumbilical and suprapubic Pain quality: aching   Pain radiates to:  Does not radiate Pain severity:  Severe Onset quality:  Sudden Duration: several hours. Timing:  Constant Progression:  Unchanged Chronicity:  New Context: not eating, not sick contacts and not suspicious food intake   Relieved by:  Nothing Worsened by:  Nothing Associated symptoms: diarrhea, hematochezia, nausea and vomiting   Associated symptoms: no chest pain, no chills, no constipation, no cough, no dysuria, no fatigue, no fever, no hematemesis and no shortness of breath  Patient recently arrived from [place]. No PHx. No prior surgeries, but has had superficial abd wall lacerations from an accident. Not currently on any medication. No known allergies.  Past Medical History No past medical history on file. There are no problems to display for this patient.  Home Medication(s) Prior to Admission medications   Medication Sig Start Date End Date Taking? Authorizing Provider  diphenhydrAMINE (BENADRYL) 25 MG tablet Take 25 mg by mouth every 6 (six) hours as needed for itching.   Yes [provider]                                                                                                                                    Past Surgical History ** The histories are not reviewed yet. Please review them in the "History" navigator section and refresh this SmartLink. Family History No family history on file.  Social History   Allergies Patient has no known allergies.  Review of Systems Review of Systems Constitutional: Negative for chills, fatigue and fever.  Respiratory: Negative for  cough and shortness of breath.   Cardiovascular: Negative for chest pain.  Gastrointestinal: Positive for abdominal pain, diarrhea, hematochezia, nausea and vomiting. Negative for constipation and hematemesis.  Genitourinary: Negative for dysuria.   All other systems are reviewed and are negative for acute change except as noted in the HPI Physical Exam Vital Signs  I have reviewed the triage vital signs BP (!) 161/107   Pulse 89   Temp 98.3 F (36.8 C) (Oral)   Resp (!) 28   Ht 6' 1.62" (1.87 m)   Wt 70 kg   SpO2 95%   BMI 20.02 kg/m   Physical Exam Vitals reviewed.  Constitutional:      General: He is not in acute distress.    Appearance: He is well-developed and well-nourished. He is not diaphoretic.  HENT:     Head: Normocephalic and atraumatic.     Jaw: No trismus.     Right Ear: External ear normal.     Left Ear: External ear normal.  Nose: Nose normal.     Mouth/Throat:     Mouth: Mucous membranes are normal.  Eyes:     General: No scleral icterus.    Extraocular Movements: EOM normal.     Conjunctiva/sclera: Conjunctivae normal.  Neck:     Trachea: Phonation normal.  Cardiovascular:     Rate and Rhythm: Normal rate and regular rhythm.  Pulmonary:     Effort: Pulmonary effort is normal. No respiratory distress.     Breath sounds: No stridor.  Abdominal:     General: There is mild distension. Several transverse scars to abdomen.    Tenderness: There is abdominal tenderness in the right lower quadrant, periumbilical area and suprapubic area. There is no guarding or rebound.  Musculoskeletal:        General: No edema. Normal range of motion.     Cervical back: Normal range of motion.  Neurological:     Mental Status: He is alert and oriented to person, place, and time.  Psychiatric:        Mood and Affect: Mood and affect normal.        Behavior: Behavior normal  ED Results and Treatments Labs (all labs ordered are listed, but only abnormal results  are displayed) Labs Reviewed  COMPREHENSIVE METABOLIC PANEL - Abnormal; Notable for the following components:      Result Value   CO2 20 (*)    Glucose, Bld 161 (*)    All other components within normal limits  CBC - Abnormal; Notable for the following components:   WBC 18.7 (*)    Hemoglobin 17.1 (*)    All other components within normal limits  RESP PANEL BY RT-PCR (FLU A&B, COVID) ARPGX2  LIPASE, BLOOD  URINALYSIS, ROUTINE W REFLEX MICROSCOPIC                                                                                                                         EKG  EKG Interpretation  Date/Time:    Ventricular Rate:    PR Interval:    QRS Duration:   QT Interval:    QTC Calculation:   R Axis:     Text Interpretation:        Radiology CT ABDOMEN PELVIS W CONTRAST  Result Date: 05/13/2020 CLINICAL DATA:  Right lower quadrant abdominal pain. Nausea and vomiting. Blood and bowel movement. EXAM: CT ABDOMEN AND PELVIS WITH CONTRAST TECHNIQUE: Multidetector CT imaging of the abdomen and pelvis was performed using the standard protocol following bolus administration of intravenous contrast. CONTRAST:  OMNIPAQUE IOHEXOL 300 MG/ML  SOLN COMPARISON:  None. FINDINGS: Lower chest: The lung bases are clear without focal nodule, mass, or airspace disease. Heart size is normal. No significant pleural or pericardial effusion is present. Hepatobiliary: No focal liver abnormality is seen. No gallstones, gallbladder wall thickening, or biliary dilatation. Pancreas: Unremarkable. No pancreatic ductal dilatation or surrounding inflammatory changes. Spleen: Normal in size without focal abnormality. Adrenals/Urinary Tract: Adrenal glands are unremarkable. Kidneys  are normal, without renal calculi, focal lesion, or hydronephrosis. Bladder is unremarkable. Stomach/Bowel: The stomach and duodenum are displaced posteriorly. Small bowel is unremarkable. Ascending and transverse colon are within normal  limits. The descending colon is mostly collapsed. Markedly dilated loops of the sigmoid colon extend into the left upper quadrant. Rotation can be seen on coronal image 45. There is no mucosal enhancement of the sigmoid colon within the volvulus. No focal wall thickening or pneumatosis is present. Vascular/Lymphatic: No significant vascular findings are present. No enlarged abdominal or pelvic lymph nodes. Reproductive: Prostate is unremarkable. Other: Free fluid is present within the abdomen pelvis. There is significant stranding about the sigmoid colon. No free air is present. Musculoskeletal: No acute or significant osseous findings. IMPRESSION: 1. Markedly dilated loops of sigmoid colon extend into the left upper quadrant, consistent with a sigmoid volvulus. Lack of mucosal enhancement suggest developing ischemia. 2. Free fluid within the abdomen pelvis is likely reactive. No free air to suggest perforation. 3. Critical Value/emergent results were called by telephone at the time of interpretation on 05/13/2020 at 1:18 am to provider [facility] , who verbally acknowledged these results. Electronically Signed   By: Marin Roberts M.D.   On: 05/13/2020 01:22    Pertinent labs & imaging results that were available during my care of the patient were reviewed by me and considered in my medical decision making (see chart for details).  Medications Ordered in ED Medications  sodium chloride 0.9 % bolus 1,000 mL (0 mLs Intravenous Stopped 05/13/20 0138)    Followed by  0.9 %  sodium chloride infusion (1,000 mLs Intravenous New Bag/Given 05/13/20 0057)  ondansetron (ZOFRAN) injection 4 mg (4 mg Intravenous Given 05/13/20 0056)  HYDROmorphone (DILAUDID) injection 0.5 mg (0.5 mg Intravenous Given 05/13/20 0054)  iohexol (OMNIPAQUE) 300 MG/ML solution 100 mL (100 mLs Intravenous Contrast Given 05/13/20 0105)  HYDROmorphone (DILAUDID) injection 0.5 mg (0.5 mg Intravenous Given 05/13/20 0138)                                                                                                                                     Procedures Procedures  (including critical care time)  Medical Decision Making / ED Course I have reviewed the nursing notes for this encounter and the patient's prior records (if available in EHR or on provided paperwork).   Todd Jordan was evaluated in Emergency Department on 05/13/2020 for the symptoms described in the history of present illness. He was evaluated in the context of the global COVID-19 pandemic, which necessitated consideration that the patient might be at risk for infection with the SARS-CoV-2 virus that causes COVID-19. Institutional protocols and algorithms that pertain to the evaluation of patients at risk for COVID-19 are in a state of rapid change based on information released by regulatory bodies including the CDC and federal and state organizations. These policies and algorithms were followed during the patient's care in  the ED.  Lower abd pain TTP to periumb, suprapubic, and RLQ area. No scrotal involvement concerning for torsion Labs notable for leukocytosis. Rest of labs are reassuring without evidence of bili obstruction or pancreatitis.  We will obtain a CT scan to better assess source of his abdominal pain and rule out serious intra-abdominal inflammatory/infectious processes.  Clinical Course as of 05/13/20 0142  Mon May 13, 2020  [id number] CT notable for sigmoid volvulus with area of ischemia but no obvious source. No evidence of perforation. Consulted Dr. Bedelia PersonLovick of general surgery who will come evaluate the patient for further management. [PC]    Clinical Course User Index [PC] Woodford Strege, Amadeo GarnetPedro Eduardo, MD     Final Clinical Impression(s) / ED Diagnoses Final diagnoses:  Sigmoid volvulus (HCC)      This chart was dictated using voice recognition software.  Despite best efforts to proofread,  errors can occur which can change the  documentation meaning.     Nira Connardama, Avelynn Sellin Eduardo, MD 05/13/20 [phone]

## 2020-05-13 NOTE — Anesthesia Procedure Notes (Signed)
Procedure Name: Intubation Date/Time: 05/13/2020 2:36 AM Performed by: Zollie Scale, CRNA Pre-anesthesia Checklist: Patient identified, Emergency Drugs available, Suction available and Patient being monitored Patient Re-evaluated:Patient Re-evaluated prior to induction Oxygen Delivery Method: Circle System Utilized Preoxygenation: Pre-oxygenation with 100% oxygen Induction Type: IV induction, Rapid sequence and Cricoid Pressure applied Laryngoscope Size: Glidescope and 4 Grade View: Grade I Tube type: Oral Tube size: 7.5 mm Number of attempts: 1 Airway Equipment and Method: Stylet and Video-laryngoscopy Placement Confirmation: ETT inserted through vocal cords under direct vision,  positive ETCO2 and breath sounds checked- equal and bilateral Secured at: 23 cm Tube secured with: Tape Dental Injury: Teeth and Oropharynx as per pre-operative assessment  Comments: Glidescope utilized d/t unknown COVID status.

## 2020-05-13 NOTE — Progress Notes (Signed)
Patient is resting now, appears to be  comfortable. Heart rate sustaining in low 110s.

## 2020-05-13 NOTE — Op Note (Signed)
   Operative Note   Date: 05/13/2020  Procedure: exploratory laparotomy, sigmoid colectomy, takedown of the splenic flexure, descending colostomy creation  Pre-op diagnosis: sigmoid volvulus Post-op diagnosis: gangrenous sigmoid colon as a result of sigmoid volvulus  Indication and clinical history: The patient is a 24 y.o. year old male with sigmoid volvulus     Surgeon: Diamantina Monks, MD Assistant: Carolynne Edouard, MD  Anesthesiologist: Michelle Piper, MD Anesthesia: General  Findings:  . Specimen: sigmoid colon . EBL: 50cc . Drains/Implants: none  Disposition: PACU - hemodynamically stable.  Description of procedure: The patient was positioned supine on the operating room table. General anesthetic induction and intubation were uneventful. Foley catheter insertion was performed and was atraumatic. Time-out was performed verifying correct patient, procedure, signature of informed consent, and administration of pre-operative antibiotics .   A lower midline incision was made and deepened down through the fascia. Distended, gangrnous colon was immediately encountered that was clearly volvulized. The mesentery was untwisted and the bowel transected using a GIA stapler proximal to the ischemic colon. The mesentery was transected using the Ligasure device and the distal end transected using a GIA stapler. The remainder of the abdomen was inspected and was unremarkable. The descending colon was mobilized by taking down the peritoneal reflection and the splenic flexure was mobilized. A circle was incised of the left abdomen and deepened through both fascial layers. The distal functional end was brought through this ostomy site. The abdomen was copiously irrigated and the fluid returned clear. The fascia was closed with #1 looped PDS suture and the skin closed with staples. Sterile dressing was applied. The ostomy was matured with 2-0 vicryl suture and an ostomy appliance placed over it.   All sponge and  instrument counts were correct at the conclusion of the procedure. The patient was awakened from anesthesia, extubated uneventfully, and transported to the PACU in good condition. There were no complications.    Diamantina Monks, MD General and Trauma Surgery [facility]

## 2020-05-14 ENCOUNTER — Encounter (HOSPITAL_COMMUNITY): Payer: Self-pay | Admitting: Surgery

## 2020-05-14 LAB — BASIC METABOLIC PANEL
Anion gap: 6 (ref 5–15)
BUN: 13 mg/dL (ref 6–20)
CO2: 26 mmol/L (ref 22–32)
Calcium: 8.2 mg/dL — ABNORMAL LOW (ref 8.9–10.3)
Chloride: 105 mmol/L (ref 98–111)
Creatinine, Ser: 1.1 mg/dL (ref 0.61–1.24)
GFR, Estimated: >60 mL/min (ref >60)
Glucose, Bld: 103 mg/dL — ABNORMAL HIGH (ref 70–99)
Potassium: 3.7 mmol/L (ref 3.5–5.1)
Sodium: 137 mmol/L (ref 135–145)

## 2020-05-14 LAB — CBC
HCT: 35.3 % — ABNORMAL LOW (ref 39.0–52.0)
Hemoglobin: 12.3 g/dL — ABNORMAL LOW (ref 13.0–17.0)
MCH: 30 pg (ref 26.0–34.0)
MCHC: 34.8 g/dL (ref 30.0–36.0)
MCV: 86.1 fL (ref 80.0–100.0)
Platelets: 144 10*3/uL — ABNORMAL LOW (ref 150–400)
RBC: 4.1 MIL/uL — ABNORMAL LOW (ref 4.22–5.81)
RDW: 13.6 % (ref 11.5–15.5)
WBC: 14.4 10*3/uL — ABNORMAL HIGH (ref 4.0–10.5)
nRBC: 0 % (ref 0.0–0.2)

## 2020-05-14 LAB — MAGNESIUM: Magnesium: 1.7 mg/dL (ref 1.7–2.4)

## 2020-05-14 LAB — PHOSPHORUS: Phosphorus: 2.9 mg/dL (ref 2.5–4.6)

## 2020-05-14 LAB — MRSA PCR SCREENING: MRSA by PCR: NEGATIVE

## 2020-05-14 MED ORDER — MUPIROCIN 2 % EX OINT: 1.0000 "application " | TOPICAL_OINTMENT | Freq: Two times a day (BID) | CUTANEOUS | Status: DC

## 2020-05-14 MED ORDER — ACETAMINOPHEN 10 MG/ML IV SOLN
1000.0000 mg | Freq: Four times a day (QID) | INTRAVENOUS | Status: AC
  Administered 2020-05-14 – 2020-05-15 (×4): 1000 mg via INTRAVENOUS
  Filled 2020-05-14 (×4): qty 100

## 2020-05-14 NOTE — Progress Notes (Signed)
RN checked incision site, still bleeding at slow rate, honeycomb dressing 1/3 of the way saturated; will continue to monitor pt.

## 2020-05-14 NOTE — Evaluation (Signed)
Occupational Therapy Evaluation Patient Details Name: Todd Jordan MRN: [id number] DOB: 03/27/1996 Today's Date: 05/14/2020    History of Present Illness 29M with acute onset abdominal pain, pt found hav sigmoid volvulus and impending bowel compromise. Pt underwent emergent ex-lap, bowel resection, and ostomy. PMH: pt and family refugee from [place], have been here a month.   Clinical Impression   This 24 y/o male presents with the above. PTA pt independent with ADL and mobility tasks. Pt presents sitting up in recliner pleasant and willing to participate in therapy session. Pt demonstrating room and hallway distance mobility (x2 laps) pushing iv pole with minguard assist throughout. Pt overall requiring minguard assist for ADL at this time. Use of ipad interpreter [facility], [phone]) during session. Pt denies pain or dizziness with being upright/mobilizing today. He reports multiple family members who can assist at time of discharge. Pt to benefit from continued acute OT services to maximize his overall safety and independence with ADL and mobility, do not anticipate he will require follow up OT services after discharge.     Follow Up Recommendations  No OT follow up;Supervision - Intermittent    Equipment Recommendations  None recommended by OT           Precautions / Restrictions Precautions Precautions: Other (comment) Precaution Comments: ostomy, abdominal incision Restrictions Weight Bearing Restrictions: No      Mobility Bed Mobility               General bed mobility comments: OOB in recliner    Transfers Overall transfer level: Needs assistance Equipment used: None Transfers: Sit to/from Stand Sit to Stand: Min guard         General transfer comment: increased time, slow and guarded but steady and safe    Balance Overall balance assessment: Mild deficits observed, not formally tested                                         ADL either  performed or assessed with clinical judgement   ADL Overall ADL's : Needs assistance/impaired Eating/Feeding: NPO   Grooming: Min guard;Standing   Upper Body Bathing: Sitting;Set up   Lower Body Bathing: Min guard;Sit to/from stand   Upper Body Dressing : Set up;Sitting   Lower Body Dressing: Min guard;Sit to/from stand   Toilet Transfer: Min guard;Ambulation Toilet Transfer Details (indicate cue type and reason): simulated via transfer to/from reclienr Toileting- Architect and Hygiene: Min guard;Sit to/from stand       Functional mobility during ADLs: Min guard                            Pertinent Vitals/Pain Pain Assessment: No/denies pain     Hand Dominance Right   Extremity/Trunk Assessment Upper Extremity Assessment Upper Extremity Assessment: Overall WFL for tasks assessed   Lower Extremity Assessment Lower Extremity Assessment: Defer to PT evaluation   Cervical / Trunk Assessment Cervical / Trunk Assessment: Other exceptions Cervical / Trunk Exceptions: recent exlap and ostomy   Communication Communication Communication: Prefers language other than English (pt speaks arabic)   Cognition Arousal/Alertness: Awake/alert Behavior During Therapy: WFL for tasks assessed/performed Overall Cognitive Status: Within Functional Limits for tasks assessed  General Comments: for basic tasks, difficult to fully assess given language barrier   General Comments  portable tele not working during session, HR start and end of session in the low 100s    Exercises     Shoulder Instructions      Home Living Family/patient expects to be discharged to:: Private residence Living Arrangements: Parent;Children Available Help at Discharge: Family;Available 24 hours/day Type of Home: House Home Access: Level entry     Home Layout: One level     Bathroom Shower/Tub: Chief Strategy Officer:  Standard     Home Equipment: None   Additional Comments: pt, parents and 6 siblings are in refugee program from [place]. Have been here for 1 month. Live in duplex - 1 for boys and 1 for girls.      Prior Functioning/Environment Level of Independence: Independent                 OT Problem List: Decreased range of motion;Decreased activity tolerance;Decreased knowledge of use of DME or AE;Decreased knowledge of precautions      OT Treatment/Interventions: Self-care/ADL training;Therapeutic exercise;DME and/or AE instruction;Therapeutic activities;Patient/family education;Balance training    OT Goals(Current goals can be found in the care plan section) Acute Rehab OT Goals Patient Stated Goal: none stated, agreeable to therapy session and motivated to mobilize OT Goal Formulation: With patient Time For Goal Achievement: 05/28/20 Potential to Achieve Goals: Good  OT Frequency: Min 2X/week   Barriers to D/C:            Co-evaluation              AM-PAC OT "6 Clicks" Daily Activity     Outcome Measure Help from another person eating meals?: A Little Help from another person taking care of personal grooming?: A Little Help from another person toileting, which includes using toliet, bedpan, or urinal?: A Little Help from another person bathing (including washing, rinsing, drying)?: A Little Help from another person to put on and taking off regular upper body clothing?: A Little Help from another person to put on and taking off regular lower body clothing?: A Little 6 Click Score: 18   End of Session Equipment Utilized During Treatment: Gait belt Nurse Communication: Mobility status  Activity Tolerance: Patient tolerated treatment well Patient left: in chair;with call bell/phone within reach  OT Visit Diagnosis: Other abnormalities of gait and mobility (R26.89)                Time: 4332-9518 OT Time Calculation (min): 22 min Charges:  OT General Charges $OT  Visit: 1 Visit OT Evaluation $OT Eval Moderate Complexity: 1 Mod  Marcy Siren, OT Acute Rehabilitation Services Pager [phone] Office [phone]   Orlando Penner 05/14/2020, 5:01 PM

## 2020-05-14 NOTE — Progress Notes (Signed)
RN's called to patient room during report due to patient bleeding.  Pt sitting up in chair and was bleeding from staple incision site mid abdomen. RN's cleansed area that had a moderate amount of bleeding with clots draining from incision.  Ostomy bag was soiled with blood from the abdominal incision, small puddle of blood in chair pad where pt was sitting.  Site was cleansed and dried, betadine applied to incision and honeycomb dressing placed over incision.  Pt still reporting no pain at this time and was ambulated back to bed and placed in supine position.  Night RN will continue to monitor site for excessive bleeding.

## 2020-05-14 NOTE — Consult Note (Addendum)
WOC Nurse ostomy follow up Patient receiving care in [facility] 4NP5.  Teleinterpreter Christen Bame, Operator [phone], used throughout teaching session. Stoma type/location: LUQ colostomy Stomal assessment/size: 1 3/8 inches, moist, pink, sutures intact Peristomal assessment: intact Treatment options for stomal/peristomal skin: barrier ring Output: none yet  Ostomy pouching: 1pc.flat, Gaston Islam. Education provided: Brother shown entire removal of pouch, and application of new pouch. Brother participated in applying new pouch and opening/closing pouch.  All questions answered to his expressed satisfaction. Enrolled patient in [place] Secure Start Discharge program: Yes The plan is to return on Friday of this week and teach whomever is present at that time. Helmut Muster, RN, MSN, CWOCN, CNS-BC, pager [phone]

## 2020-05-14 NOTE — Progress Notes (Signed)
[facility] Progress Note  1 Day Post-Op  Subjective: CC-  Brother at bedside. Comfortable this morning. Denies any abdominal pain, nausea, vomiting. Having a lot of loose stool per rectum. No air or stool from colostomy yet. NG tube in place with no output.   Objective: Vital signs in last 24 hours: Temp:  [97.5 F (36.4 C)-99.7 F (37.6 C)] 97.5 F (36.4 C) (12/28 0729) Pulse Rate:  [83-105] 97 (12/28 0729) Resp:  [13-20] 20 (12/28 0729) BP: (115-126)/(71-81) 120/73 (12/28 0729) SpO2:  [97 %-98 %] 97 % (12/28 0729) Last BM Date: 05/14/20  Intake/Output from previous day: 12/27 0701 - 12/28 0700 In: 1500 [I.V.:1200; IV Piggyback:300] Out: 1500 [Urine:1500] Intake/Output this shift: No intake/output data recorded.  PE: Gen:  Alert, NAD, pleasant Card:  RRR Pulm: rate and effort normal Abd: Soft, NT/ND, few BS heard, midline incision cdi with staples intact, ostomy viable with no air or stool in pouch  Lab Results:  Recent Labs    05/13/20 0712 05/13/20 1153  WBC 20.4* 18.2*  HGB 16.8 15.9  HCT 50.3 45.2  PLT 197 177   BMET Recent Labs    05/13/20 0712 05/13/20 1153  NA 133* 134*  K 6.2* 4.5  CL 104 106  CO2 15* 18*  GLUCOSE 161* 160*  BUN 18 19  CREATININE 1.35* 1.13  CALCIUM 8.8* 8.1*   PT/INR No results for input(s): LABPROT, INR in the last 72 hours. CMP     Component Value Date/Time   NA 134 (L) 05/13/2020 1153   K 4.5 05/13/2020 1153   CL 106 05/13/2020 1153   CO2 18 (L) 05/13/2020 1153   GLUCOSE 160 (H) 05/13/2020 1153   BUN 19 05/13/2020 1153   CREATININE 1.13 05/13/2020 1153   CALCIUM 8.1 (L) 05/13/2020 1153   PROT 7.7 05/12/2020 2207   ALBUMIN 4.4 05/12/2020 2207   AST 26 05/12/2020 2207   ALT 22 05/12/2020 2207   ALKPHOS 54 05/12/2020 2207   BILITOT 0.8 05/12/2020 2207   GFRNONAA >60 05/13/2020 1153   Lipase     Component Value Date/Time   LIPASE 17 05/12/2020 2207       Studies/Results: CT ABDOMEN PELVIS W  CONTRAST  Result Date: 05/13/2020 CLINICAL DATA:  Right lower quadrant abdominal pain. Nausea and vomiting. Blood and bowel movement. EXAM: CT ABDOMEN AND PELVIS WITH CONTRAST TECHNIQUE: Multidetector CT imaging of the abdomen and pelvis was performed using the standard protocol following bolus administration of intravenous contrast. CONTRAST:  OMNIPAQUE IOHEXOL 300 MG/ML  SOLN COMPARISON:  None. FINDINGS: Lower chest: The lung bases are clear without focal nodule, mass, or airspace disease. Heart size is normal. No significant pleural or pericardial effusion is present. Hepatobiliary: No focal liver abnormality is seen. No gallstones, gallbladder wall thickening, or biliary dilatation. Pancreas: Unremarkable. No pancreatic ductal dilatation or surrounding inflammatory changes. Spleen: Normal in size without focal abnormality. Adrenals/Urinary Tract: Adrenal glands are unremarkable. Kidneys are normal, without renal calculi, focal lesion, or hydronephrosis. Bladder is unremarkable. Stomach/Bowel: The stomach and duodenum are displaced posteriorly. Small bowel is unremarkable. Ascending and transverse colon are within normal limits. The descending colon is mostly collapsed. Markedly dilated loops of the sigmoid colon extend into the left upper quadrant. Rotation can be seen on coronal image 45. There is no mucosal enhancement of the sigmoid colon within the volvulus. No focal wall thickening or pneumatosis is present. Vascular/Lymphatic: No significant vascular findings are present. No enlarged abdominal or pelvic lymph nodes. Reproductive:  Prostate is unremarkable. Other: Free fluid is present within the abdomen pelvis. There is significant stranding about the sigmoid colon. No free air is present. Musculoskeletal: No acute or significant osseous findings. IMPRESSION: 1. Markedly dilated loops of sigmoid colon extend into the left upper quadrant, consistent with a sigmoid volvulus. Lack of mucosal  enhancement suggest developing ischemia. 2. Free fluid within the abdomen pelvis is likely reactive. No free air to suggest perforation. 3. Critical Value/emergent results were called by telephone at the time of interpretation on 05/13/2020 at 1:18 am to provider [facility] , who verbally acknowledged these results. Electronically Signed   By: Marin Roberts M.D.   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 Bedelia Person - POD#1 - surgical path pending - WOC following for new ostomy - keep NPO and await return in bowel function  ID - zosyn 12/27 x1 FEN - IVF, NPO/NGT VTE - SCDs, lovenox Foley - none Follow up - Dr. Bedelia Person  Plan - Labs pending. NG tube not working, will d/c NG tube but keep NPO. Mobilize. PT/OT. Continue scheduled nonnarcotics for pain control.    LOS: 1 day    Franne Forts, [facility] 05/14/2020, 10:35 AM Please see Amion for pager number during day hours 7:00am-4:30pm

## 2020-05-15 LAB — CBC
HCT: 31.7 % — ABNORMAL LOW (ref 39.0–52.0)
Hemoglobin: 11.5 g/dL — ABNORMAL LOW (ref 13.0–17.0)
MCH: 31.1 pg (ref 26.0–34.0)
MCHC: 36.3 g/dL — ABNORMAL HIGH (ref 30.0–36.0)
MCV: 85.7 fL (ref 80.0–100.0)
Platelets: 139 10*3/uL — ABNORMAL LOW (ref 150–400)
RBC: 3.7 MIL/uL — ABNORMAL LOW (ref 4.22–5.81)
RDW: 13.9 % (ref 11.5–15.5)
WBC: 14.6 10*3/uL — ABNORMAL HIGH (ref 4.0–10.5)
nRBC: 0 % (ref 0.0–0.2)

## 2020-05-15 LAB — BASIC METABOLIC PANEL
Anion gap: 10 (ref 5–15)
BUN: 8 mg/dL (ref 6–20)
CO2: 23 mmol/L (ref 22–32)
Calcium: 8.2 mg/dL — ABNORMAL LOW (ref 8.9–10.3)
Chloride: 102 mmol/L (ref 98–111)
Creatinine, Ser: 0.9 mg/dL (ref 0.61–1.24)
GFR, Estimated: >60 mL/min (ref >60)
Glucose, Bld: 79 mg/dL (ref 70–99)
Potassium: 3.2 mmol/L — ABNORMAL LOW (ref 3.5–5.1)
Sodium: 135 mmol/L (ref 135–145)

## 2020-05-15 LAB — SURGICAL PATHOLOGY

## 2020-05-15 MED ORDER — ACETAMINOPHEN 500 MG PO TABS
1000.0000 mg | ORAL_TABLET | Freq: Four times a day (QID) | ORAL | Status: DC | PRN
  Administered 2020-05-15 – 2020-05-17 (×3): 1000 mg via ORAL
  Filled 2020-05-15 (×3): qty 2

## 2020-05-15 MED ORDER — POTASSIUM CHLORIDE IN NACL 20-0.9 MEQ/L-% IV SOLN
INTRAVENOUS | Status: DC
  Administered 2020-05-16: 1000 mL via INTRAVENOUS
  Filled 2020-05-15 (×5): qty 1000

## 2020-05-15 NOTE — Progress Notes (Signed)
2 Days Post-Op   Subjective/Chief Complaint: Pain controlled Denies nausea   Objective: Vital signs in last 24 hours: Temp:  [98 F (36.7 C)-98.2 F (36.8 C)] 98.2 F (36.8 C) (12/29 0728) Pulse Rate:  [78-97] 85 (12/29 0728) Resp:  [13-17] 13 (12/29 0728) BP: (115-135)/(66-91) 115/79 (12/29 0728) SpO2:  [94 %-98 %] 98 % (12/29 0728) Last BM Date: 05/14/20  Intake/Output from previous day: 12/28 0701 - 12/29 0700 In: 1350 [I.V.:1050; IV Piggyback:300] Out: 1050 [Urine:1050] Intake/Output this shift: No intake/output data recorded.  Exam: Awake and alert Appears comfortable Abdomen soft, ostomy pink, wound clean  Lab Results:  Recent Labs    05/14/20 1125 05/15/20 0439  WBC 14.4* 14.6*  HGB 12.3* 11.5*  HCT 35.3* 31.7*  PLT 144* 139*   BMET Recent Labs    05/14/20 1125 05/15/20 0439  NA 137 135  K 3.7 3.2*  CL 105 102  CO2 26 23  GLUCOSE 103* 79  BUN 13 8  CREATININE 1.10 0.90  CALCIUM 8.2* 8.2*   PT/INR No results for input(s): LABPROT, INR in the last 72 hours. ABG No results for input(s): PHART, HCO3 in the last 72 hours.  Invalid input(s): PCO2, PO2  Studies/Results: No results found.  Anti-infectives: Anti-infectives (From admission, onward)   Start     Dose/Rate Route Frequency Ordered Stop   05/13/20 1000  piperacillin-tazobactam (ZOSYN) IVPB 3.375 g        3.375 g 100 mL/hr over 30 Minutes Intravenous Every 8 hours 05/13/20 0407 05/13/20 1857   05/13/20 0245  piperacillin-tazobactam (ZOSYN) IVPB 3.375 g        3.375 g 100 mL/hr over 30 Minutes Intravenous  Once 05/13/20 0241 05/13/20 0310      Assessment/Plan: s/p Procedure(s): EXPLORATORY LAPAROTOMY MOTION OF SPLENIC FLEXTION (N/A) OSTOMY (N/A) COLON RESECTION SIGMOID (N/A)   Will try clear liquids today Change IVF Repeat labs in the morning.  WBC still up.  May need to resume antibiotics   LOS: 2 days    Abigail Miyamoto MD 05/15/2020

## 2020-05-15 NOTE — Progress Notes (Signed)
Physical Therapy Treatment Patient Details Name: Cartel Mauss MRN: [id number] DOB: 03/27/1996 Today's Date: 05/15/2020    History of Present Illness 63M with acute onset abdominal pain, pt found hav sigmoid volvulus and impending bowel compromise. Pt underwent emergent ex-lap, bowel resection, and ostomy. PMH: pt and family refugee from [place], have been here a month.    PT Comments    Pt progressing well with mobility. He required min guard assist bed mobility, transfers, and ambulation 300' pushing IV pole. He presents with steady but guarded gait. Pt appears timid to move with with all his lines/IVs attached. Pt returned to bed at end of session. Pt spoke a little [place]. Visitor present in room to assist with translation.    Follow Up Recommendations  No PT follow up;Supervision - Intermittent     Equipment Recommendations  None recommended by PT    Recommendations for Other Services       Precautions / Restrictions Precautions Precautions: Other (comment) Precaution Comments: ostomy, abdominal incision    Mobility  Bed Mobility Overal bed mobility: Needs Assistance Bed Mobility: Supine to Sit;Sit to Supine     Supine to sit: Min guard;HOB elevated Sit to supine: Min guard;HOB elevated   General bed mobility comments: min guard assist for safety/lines  Transfers Overall transfer level: Needs assistance Equipment used: None Transfers: Sit to/from Stand Sit to Stand: Min guard         General transfer comment: increased time, slow and guarded but steady  Ambulation/Gait Ambulation/Gait assistance: Min guard Gait Distance (Feet): 300 Feet Assistive device: IV Pole Gait Pattern/deviations: Step-through pattern;Decreased stride length Gait velocity: mildly decreased Gait velocity interpretation: 1.31 - 2.62 ft/sec, indicative of limited community ambulator General Gait Details: guarded but steady Development worker, community     Modified Rankin (Stroke Patients Only)       Balance Overall balance assessment: No apparent balance deficits (not formally assessed)                                          Cognition Arousal/Alertness: Awake/alert Behavior During Therapy: WFL for tasks assessed/performed Overall Cognitive Status: Within Functional Limits for tasks assessed                                 General Comments: for basic tasks, difficult to fully assess given language barrier      Exercises      General Comments General comments (skin integrity, edema, etc.): HR in low 100s during mobility      Pertinent Vitals/Pain Pain Assessment: Faces Faces Pain Scale: Hurts a little bit Pain Location: abdomen Pain Descriptors / Indicators: Discomfort Pain Intervention(s): Monitored during session    Home Living                      Prior Function            PT Goals (current goals can now be found in the care plan section) Acute Rehab PT Goals Patient Stated Goal: none stated Progress towards PT goals: Progressing toward goals    Frequency    Min 3X/week      PT Plan Current plan remains appropriate    Co-evaluation  AM-PAC PT "6 Clicks" Mobility   Outcome Measure  Help needed turning from your back to your side while in a flat bed without using bedrails?: None Help needed moving from lying on your back to sitting on the side of a flat bed without using bedrails?: A Little Help needed moving to and from a bed to a chair (including a wheelchair)?: A Little Help needed standing up from a chair using your arms (e.g., wheelchair or bedside chair)?: A Little Help needed to walk in hospital room?: A Little Help needed climbing 3-5 steps with a railing? : A Little 6 Click Score: 19    End of Session Equipment Utilized During Treatment: Gait belt Activity Tolerance: Patient tolerated treatment well Patient left: in bed;with  call bell/phone within reach;with family/visitor present Nurse Communication: Mobility status PT Visit Diagnosis: Unsteadiness on feet (R26.81)     Time: 1607-3710 PT Time Calculation (min) (ACUTE ONLY): 17 min  Charges:  $Gait Training: 8-22 mins                     Aida Raider, PT  Office # [phone] Pager [phone]    Ilda Foil 05/15/2020, 12:33 PM

## 2020-05-15 NOTE — Progress Notes (Signed)
Pt's abdominal midline incision checked after pt walked with PT in hall. Dressing remains c/d/i and was lifted to visualize incision. Staples intact, with no bleeding.   Robina Ade, RN

## 2020-05-16 ENCOUNTER — Inpatient Hospital Stay (HOSPITAL_COMMUNITY): Payer: Medicaid Other

## 2020-05-16 LAB — BASIC METABOLIC PANEL
Anion gap: 9 (ref 5–15)
BUN: 6 mg/dL (ref 6–20)
CO2: 25 mmol/L (ref 22–32)
Calcium: 8.4 mg/dL — ABNORMAL LOW (ref 8.9–10.3)
Chloride: 103 mmol/L (ref 98–111)
Creatinine, Ser: 0.83 mg/dL (ref 0.61–1.24)
GFR, Estimated: >60 mL/min (ref >60)
Glucose, Bld: 105 mg/dL — ABNORMAL HIGH (ref 70–99)
Potassium: 3.2 mmol/L — ABNORMAL LOW (ref 3.5–5.1)
Sodium: 137 mmol/L (ref 135–145)

## 2020-05-16 LAB — CBC
HCT: 32.2 % — ABNORMAL LOW (ref 39.0–52.0)
Hemoglobin: 11.5 g/dL — ABNORMAL LOW (ref 13.0–17.0)
MCH: 30.3 pg (ref 26.0–34.0)
MCHC: 35.7 g/dL (ref 30.0–36.0)
MCV: 84.7 fL (ref 80.0–100.0)
Platelets: 156 10*3/uL (ref 150–400)
RBC: 3.8 MIL/uL — ABNORMAL LOW (ref 4.22–5.81)
RDW: 13.7 % (ref 11.5–15.5)
WBC: 11.1 10*3/uL — ABNORMAL HIGH (ref 4.0–10.5)
nRBC: 0 % (ref 0.0–0.2)

## 2020-05-16 MED ORDER — POTASSIUM CHLORIDE CRYS ER 20 MEQ PO TBCR
30.0000 meq | EXTENDED_RELEASE_TABLET | Freq: Two times a day (BID) | ORAL | Status: AC
  Administered 2020-05-16 (×2): 30 meq via ORAL
  Filled 2020-05-16 (×2): qty 1

## 2020-05-16 MED ORDER — POLYETHYLENE GLYCOL 3350 17 G PO PACK
17.0000 g | PACK | Freq: Every day | ORAL | Status: DC
  Administered 2020-05-16 – 2020-05-18 (×3): 17 g via ORAL
  Filled 2020-05-16 (×3): qty 1

## 2020-05-16 NOTE — Progress Notes (Signed)
Occupational Therapy Treatment Patient Details Name: Todd Jordan MRN: [id number] DOB: 03/27/1996 Today's Date: 05/16/2020    History of present illness 65M with acute onset abdominal pain, pt found hav sigmoid volvulus and impending bowel compromise. Pt underwent emergent ex-lap, bowel resection, and ostomy. PMH: pt and family refugee from [place], have been here a month.   OT comments  Pt progressing towards established OT goals. Pt pleasant and agreeable to therapy. Pt with incontinence of stool in bed; reporting he knew but didn't know what to do. Unsure if pt was confused due to placement of new ostomy. Pt performing sponge bath and grooming at sink with Supervision. Pt completing functional mobility in hallway with Supervision and holding IV pole. Use of interpreter [name] [phone]. Continue to recommend dc to home once medically stable per physician. Will continue to follow acutely as admitted.    Follow Up Recommendations  No OT follow up;Supervision - Intermittent    Equipment Recommendations  None recommended by OT    Recommendations for Other Services      Precautions / Restrictions Precautions Precautions: Other (comment) Precaution Comments: ostomy, abdominal incision       Mobility Bed Mobility Overal bed mobility: Needs Assistance Bed Mobility: Supine to Sit     Supine to sit: Supervision     General bed mobility comments: supervision for safety. dizzy upon sitting at EOB  Transfers Overall transfer level: Needs assistance Equipment used: None Transfers: Sit to/from Stand Sit to Stand: Supervision         General transfer comment: Supervision for safety    Balance Overall balance assessment: No apparent balance deficits (not formally assessed)                                         ADL either performed or assessed with clinical judgement   ADL Overall ADL's : Needs assistance/impaired     Grooming: Wash/dry face;Wash/dry  hands;Oral care;Applying deodorant;Supervision/safety;Standing   Upper Body Bathing: Supervision/ safety;Set up;Standing   Lower Body Bathing: Supervison/ safety;Sit to/from stand   Upper Body Dressing : Supervision/safety;Standing                   Functional mobility during ADLs: Supervision/safety (pushing IV pole) General ADL Comments: Pt performing sponge bath at sink and grooming with supervision for safety.     Vision       Perception     Praxis      Cognition Arousal/Alertness: Awake/alert Behavior During Therapy: WFL for tasks assessed/performed Overall Cognitive Status: Within Functional Limits for tasks assessed                                 General Comments: WFL for ADLs and following commands. Slight difficulty to fully assess with language barrier. Pt also with stool in bed and reporting he knew he had a "accident" in the bed but just didnt know what to do        Exercises     Shoulder Instructions       General Comments Dizziness upon sitting at EOB; BP stable. Brother arriving at end of session.    Pertinent Vitals/ Pain       Pain Assessment: Faces Faces Pain Scale: Hurts a little bit Pain Location: abdomen Pain Descriptors / Indicators: Discomfort Pain Intervention(s): Monitored during session;Limited activity within patient's  tolerance;Repositioned  Home Living                                          Prior Functioning/Environment              Frequency  Min 2X/week        Progress Toward Goals  OT Goals(current goals can now be found in the care plan section)  Progress towards OT goals: Progressing toward goals  Acute Rehab OT Goals Patient Stated Goal: none stated OT Goal Formulation: With patient Time For Goal Achievement: 05/28/20 Potential to Achieve Goals: Good ADL Goals Pt Will Perform Grooming: with modified independence;standing Pt Will Perform Lower Body Bathing: with  modified independence;sit to/from stand Pt Will Perform Upper Body Dressing: with modified independence;sitting Pt Will Perform Lower Body Dressing: with modified independence;sit to/from stand Pt Will Transfer to Toilet: with modified independence;ambulating Pt Will Perform Toileting - Clothing Manipulation and hygiene: with modified independence;sit to/from stand  Plan Discharge plan remains appropriate    Co-evaluation                 AM-PAC OT "6 Clicks" Daily Activity     Outcome Measure   Help from another person eating meals?: A Little Help from another person taking care of personal grooming?: A Little Help from another person toileting, which includes using toliet, bedpan, or urinal?: A Little Help from another person bathing (including washing, rinsing, drying)?: A Little Help from another person to put on and taking off regular upper body clothing?: A Little Help from another person to put on and taking off regular lower body clothing?: A Little 6 Click Score: 18    End of Session    OT Visit Diagnosis: Other abnormalities of gait and mobility (R26.89)   Activity Tolerance Patient tolerated treatment well   Patient Left in chair;with call bell/phone within reach;with family/visitor present   Nurse Communication Mobility status        Time: 5361-4431 OT Time Calculation (min): 42 min  Charges: OT General Charges $OT Visit: 1 Visit OT Treatments $Self Care/Home Management : 38-52 mins  Todd Jordan MSOT, OTR/L Acute Rehab Pager: [phone] Office: [phone]   Todd Jordan Todd Jordan 05/16/2020, 4:45 PM

## 2020-05-16 NOTE — Progress Notes (Signed)
3 Days Post-Op  Subjective: CC: Patient reports some abdominal soreness that is well controlled on medications. Tolerated diet without n/v or increased abdominal pain. Some air in colostomy bag. No stool output. No further drainage from midline wound. Mobilizing in the halls with therapies. Voiding.   Objective: Vital signs in last 24 hours: Temp:  [98.5 F (36.9 C)-101.4 F (38.6 C)] 99.2 F (37.3 C) (12/30 0749) Pulse Rate:  [83-97] 96 (12/30 0749) Resp:  [16-20] 16 (12/30 0749) BP: (113-141)/(63-98) 123/75 (12/30 0749) SpO2:  [92 %-98 %] 92 % (12/30 0749) Last BM Date: 05/14/20  Intake/Output from previous day: 12/29 0701 - 12/30 0700 In: 2030 [P.O.:1180; I.V.:650; IV Piggyback:200] Out: 3100 [Urine:3100] Intake/Output this shift: No intake/output data recorded.  PE: Gen:  Alert, NAD, pleasant Card:  RRR Pulm:  CTAB, no W/R/R, effort normal Abd: Soft, ND, minimal but appropriate tenderness around midline wound. Midline wound with staples in place. C/d/i. Colostomy with air and sweat in bag. No stool. Stoma pink and healthy appearsing  Ext:  No LE edema  Psych: A&Ox4 Skin: no rashes noted, warm and dry  Lab Results:  Recent Labs    05/15/20 0439 05/16/20 0335  WBC 14.6* 11.1*  HGB 11.5* 11.5*  HCT 31.7* 32.2*  PLT 139* 156   BMET Recent Labs    05/15/20 0439 05/16/20 0335  NA 135 137  K 3.2* 3.2*  CL 102 103  CO2 23 25  GLUCOSE 79 105*  BUN 8 6  CREATININE 0.90 0.83  CALCIUM 8.2* 8.4*   PT/INR No results for input(s): LABPROT, INR in the last 72 hours. CMP     Component Value Date/Time   NA 137 05/16/2020 0335   K 3.2 (L) 05/16/2020 0335   CL 103 05/16/2020 0335   CO2 25 05/16/2020 0335   GLUCOSE 105 (H) 05/16/2020 0335   BUN 6 05/16/2020 0335   CREATININE 0.83 05/16/2020 0335   CALCIUM 8.4 (L) 05/16/2020 0335   PROT 7.7 05/12/2020 2207   ALBUMIN 4.4 05/12/2020 2207   AST 26 05/12/2020 2207   ALT 22 05/12/2020 2207   ALKPHOS 54  05/12/2020 2207   BILITOT 0.8 05/12/2020 2207   GFRNONAA >60 05/16/2020 0335   Lipase     Component Value Date/Time   LIPASE 17 05/12/2020 2207       Studies/Results: No results found.  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 Bedelia Person - POD#3 - surgical path as noted below  - WOC following for new ostomy - Starting to have some flatus out of colostomy. No stool yet. Tolerating cld without n/v. Adv to FLD.  - Mobilize. Cleared by PT - Pulm toilet   ID - Zosyn 12/27 x1. Fever overnight. WBC down from 14.6 to 11.1. CXR today.  FEN - IVF, FLD (replace k) VTE - SCDs,  lovenox Foley - none Follow up - Dr. Bedelia Person  Path A. COLON, SIGMOID, RESECTION:  - Segment of colon (59 cm) showing congestion, hemorrhage and associated  ischemic necrosis, consistent with history of volvulus  - Attached mesentery with congestion  - Benign lymph nodes with congestion   LOS: 3 days    Jacinto Halim , [facility] Surgery 05/16/2020, 9:06 AM Please see Amion for pager number during day hours 7:00am-4:30pm

## 2020-05-17 LAB — BASIC METABOLIC PANEL
Anion gap: 11 (ref 5–15)
BUN: 10 mg/dL (ref 6–20)
CO2: 23 mmol/L (ref 22–32)
Calcium: 8.5 mg/dL — ABNORMAL LOW (ref 8.9–10.3)
Chloride: 103 mmol/L (ref 98–111)
Creatinine, Ser: 0.83 mg/dL (ref 0.61–1.24)
GFR, Estimated: >60 mL/min (ref >60)
Glucose, Bld: 91 mg/dL (ref 70–99)
Potassium: 3.8 mmol/L (ref 3.5–5.1)
Sodium: 137 mmol/L (ref 135–145)

## 2020-05-17 LAB — TROPONIN I (HIGH SENSITIVITY)
Troponin I (High Sensitivity): 15 ng/L (ref <18)
Troponin I (High Sensitivity): 10 ng/L (ref <18)

## 2020-05-17 LAB — CBC
HCT: 32.6 % — ABNORMAL LOW (ref 39.0–52.0)
Hemoglobin: 11 g/dL — ABNORMAL LOW (ref 13.0–17.0)
MCH: 29.6 pg (ref 26.0–34.0)
MCHC: 33.7 g/dL (ref 30.0–36.0)
MCV: 87.6 fL (ref 80.0–100.0)
Platelets: 192 10*3/uL (ref 150–400)
RBC: 3.72 MIL/uL — ABNORMAL LOW (ref 4.22–5.81)
RDW: 14 % (ref 11.5–15.5)
WBC: 13.9 10*3/uL — ABNORMAL HIGH (ref 4.0–10.5)
nRBC: 0 % (ref 0.0–0.2)

## 2020-05-17 NOTE — Progress Notes (Signed)
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 Pt underwent emergent ex-lap, bowel resection, and ostomy. PMH: pt and family refugee from [place], have been here a month.    PT Comments    The pt was eager and willing to participate in PT session with focus on reducing reliance on UE support for gait. The pt was able to demo improved independence with bed mobility and initial transfers from EOB, needing no UE support and supervision only to complete. The pt was then able to complete a bout of hallway ambulation with challenges such as sudden stops, turns, gait speed changes, and stepping over obstacles without need for UE support or assist to maintain balance. The pt and his mother expressed no further questions regarding mobility or d/c plan. The pt is safe to return home with family supervision when medically cleared for anticipated d/c home.     Follow Up Recommendations  No PT follow up;Supervision - Intermittent     Equipment Recommendations  None recommended by PT    Recommendations for Other Services       Precautions / Restrictions Precautions Precautions: Other (comment) Precaution Comments: ostomy, abdominal incision Restrictions Weight Bearing Restrictions: No    Mobility  Bed Mobility Overal bed mobility: Needs Assistance Bed Mobility: Supine to Sit     Supine to sit: Supervision     General bed mobility comments: supervision for line management, no assist needed. no dizziness upon sitting EOB this session  Transfers Overall transfer level: Needs assistance Equipment used: None Transfers: Sit to/from Stand Sit to Stand: Supervision         General transfer comment: Supervision for safety  Ambulation/Gait Ambulation/Gait assistance: Min  guard;Supervision Gait Distance (Feet): 200 Feet Assistive device: IV Pole;None Gait Pattern/deviations: Step-through pattern;Decreased stride length Gait velocity: 0.6 m/s Gait velocity interpretation: 1.31 - 2.62 ft/sec, indicative of limited community ambulator General Gait Details: guarded but able to complete balance challenges with gait without UE support and without instability. no LOB during session with or without UE support. progressed from minG to supervision within session       Balance Overall balance assessment: No apparent balance deficits (not formally assessed)                           High level balance activites: Turns;Head turns;Other (comment) (stepping over objects, increaed walking speed) High Level Balance Comments: pt able to complete without UE support, no evidence of instability            Cognition Arousal/Alertness: Awake/alert Behavior During Therapy: WFL for tasks assessed/performed Overall Cognitive Status: Within Functional Limits for tasks assessed                                 General Comments: WFL for ADLs and following commands. Slight difficulty to fully assess with language barrier. Pt also with stool in bed and reporting he knew he had a "accident" in the bed but just didnt know what to do      Exercises      General Comments General comments (skin integrity, edema, etc.): no dizziness today's session. used translator Marna [phone]      Pertinent Vitals/Pain Pain Assessment: No/denies pain Pain Intervention(s): Monitored during session (  pt denied pain multiple times through session)    Home Living                      Prior Function            PT Goals (current goals can now be found in the care plan section) Acute Rehab PT Goals Patient Stated Goal: none stated PT Goal Formulation: With patient Time For Goal Achievement: 05/26/20 Potential to Achieve Goals: Good Progress towards PT  goals: Progressing toward goals    Frequency    Min 3X/week      PT Plan Current plan remains appropriate       AM-PAC PT "6 Clicks" Mobility   Outcome Measure  Help needed turning from your back to your side while in a flat bed without using bedrails?: None Help needed moving from lying on your back to sitting on the side of a flat bed without using bedrails?: None Help needed moving to and from a bed to a chair (including a wheelchair)?: None Help needed standing up from a chair using your arms (e.g., wheelchair or bedside chair)?: None Help needed to walk in hospital room?: A Little Help needed climbing 3-5 steps with a railing? : A Little 6 Click Score: 22    End of Session Equipment Utilized During Treatment: Gait belt Activity Tolerance: Patient tolerated treatment well Patient left: in chair;with call bell/phone within reach;with family/visitor present Nurse Communication: Mobility status PT Visit Diagnosis: Unsteadiness on feet (R26.81)     Time: 1884-1660 PT Time Calculation (min) (ACUTE ONLY): 21 min  Charges:  $Gait Training: 8-22 mins                     Rolm Baptise, PT, DPT   Acute Rehabilitation Department Pager #: [phone]   Gaetana Michaelis 05/17/2020, 2:25 PM

## 2020-05-17 NOTE — Progress Notes (Signed)
4 Days Post-Op  Subjective: CC: No complaints this morning.  Passing stool.  Tolerating liquids.  Objective: Vital signs in last 24 hours: Temp:  [98.3 F (36.8 C)-100.9 F (38.3 C)] 98.3 F (36.8 C) (12/31 0737) Pulse Rate:  [60-91] 65 (12/31 0737) Resp:  [14-20] 15 (12/31 0737) BP: (122-145)/(81-88) 139/81 (12/31 0737) SpO2:  [98 %-100 %] 100 % (12/31 0737) Last BM Date: 05/17/20  Intake/Output from previous day: 12/30 0701 - 12/31 0700 In: 240 [P.O.:240] Out: 850 [Urine:850] Intake/Output this shift: No intake/output data recorded.  PE: Gen:  Alert, NAD, pleasant Card:  RRR Pulm:  CTAB, no W/R/R, effort normal Abd: Soft, ND, minimal but appropriate tenderness around midline wound. Midline wound with staples in place. C/d/i. Colostomy with stool in bag. Stoma pink and healthy appearsing  Ext:  No LE edema  Psych: A&Ox4 Skin: no rashes noted, warm and dry  Lab Results:  Recent Labs    05/16/20 0335 05/17/20 0410  WBC 11.1* 13.9*  HGB 11.5* 11.0*  HCT 32.2* 32.6*  PLT 156 192   BMET Recent Labs    05/16/20 0335 05/17/20 0410  NA 137 137  K 3.2* 3.8  CL 103 103  CO2 25 23  GLUCOSE 105* 91  BUN 6 10  CREATININE 0.83 0.83  CALCIUM 8.4* 8.5*   PT/INR No results for input(s): LABPROT, INR in the last 72 hours. CMP     Component Value Date/Time   NA 137 05/17/2020 0410   K 3.8 05/17/2020 0410   CL 103 05/17/2020 0410   CO2 23 05/17/2020 0410   GLUCOSE 91 05/17/2020 0410   BUN 10 05/17/2020 0410   CREATININE 0.83 05/17/2020 0410   CALCIUM 8.5 (L) 05/17/2020 0410   PROT 7.7 05/12/2020 2207   ALBUMIN 4.4 05/12/2020 2207   AST 26 05/12/2020 2207   ALT 22 05/12/2020 2207   ALKPHOS 54 05/12/2020 2207   BILITOT 0.8 05/12/2020 2207   GFRNONAA >60 05/17/2020 0410   Lipase     Component Value Date/Time   LIPASE 17 05/12/2020 2207       Studies/Results: DG CHEST PORT 1 VIEW  Result Date: 05/16/2020 CLINICAL DATA:  Fever EXAM: PORTABLE  CHEST 1 VIEW COMPARISON:  None. FINDINGS: Lungs are clear. Heart upper normal in size with pulmonary vascularity normal. No adenopathy. No bone lesions. IMPRESSION: Lungs clear.  Heart upper normal in size. Electronically Signed   By: Bretta Bang III M.D.   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 Bedelia Person - surgical path as noted below  - WOC following for new ostomy -Passing stool, advance to regular diet - Mobilize. Cleared by PT - Pulm toilet  - Some chest pain overnight, troponin negative, low suspicion for cardiac issue this young gentleman  If he does well  with regular diet today, plan for discharge tomorrow, only thing that may hold up discharge is his persistent leukocytosis  ID - fever overnight, possibly atelectasis. [id number]. CXR today.  FEN - regular diet VTE - SCDs, lovenox Foley - none Follow up - Dr. Bedelia Person  Path A. COLON, SIGMOID, RESECTION:  - Segment of colon (59 cm) showing congestion, hemorrhage and associated  ischemic necrosis, consistent with history of volvulus  - Attached mesentery with congestion  - Benign lymph nodes with congestion   LOS: 4 days    Todd Jordan , [facility] Surgery 05/17/2020, 11:52 AM Please see Amion for pager number during day hours 7:00am-4:30pm

## 2020-05-17 NOTE — Plan of Care (Signed)

## 2020-05-17 NOTE — Consult Note (Addendum)
WOC Nurse ostomy follow up Patient receiving care in [facility] 4N05.  Mother of patient in room and participating in ostomy care. TeleInterpreter Ahmed, Operator [phone] used throughout encounter. Stoma type/location: LUQ colostomy Stomal assessment/size: 1 and 3/8 inches round, budded, half red, half brown, sutures intact Peristomal assessment: intact Treatment options for stomal/peristomal skin: barrier ring Output: thin brown  Ostomy pouching: 1pc.flat, Lawson # [phone]; barrier ring, [place] [phone] Education provided: opening/closing, emptying, removing, sizing new pouch opening, cleaning around stoma, use of barrier ring, frequency of changing and emptying.  Enrolled patient in [place] Secure Start Discharge program: Yes, previously. Mother's brother in law can read and speak both English and Arabic and can help them with interacting with [employer] stores after discharge. Supplies in room. ot follow at this time.  Please re-consult the WOC team if needed.  Helmut Muster, RN, MSN, CWOCN, CNS-BC, pager [phone]

## 2020-05-17 NOTE — Progress Notes (Signed)
EKG monitor notes ST elevation in lead II. Pt SB to NSR. VSS while on RA with no signs of distress or SOB. Pt denies chest pain and sates pain level currently 0/10. Paged Dr. Freida Busman and per MD to order STAT EKG and troponin.

## 2020-05-18 MED ORDER — OXYCODONE HCL 5 MG PO TABS: ORAL_TABLET | ORAL | 0 refills | Status: DC

## 2020-05-18 MED ORDER — IBUPROFEN 200 MG PO TABS: 600.0000 mg | ORAL_TABLET | Freq: Four times a day (QID) | ORAL | Status: DC | PRN

## 2020-05-18 MED ORDER — ACETAMINOPHEN 500 MG PO TABS: ORAL_TABLET | ORAL | 0 refills | Status: DC

## 2020-05-18 MED ORDER — IBUPROFEN 200 MG PO TABS: ORAL_TABLET | ORAL | Status: DC

## 2020-05-18 MED ORDER — METHOCARBAMOL 500 MG PO TABS: 500.0000 mg | ORAL_TABLET | Freq: Three times a day (TID) | ORAL | 0 refills | Status: DC | PRN

## 2020-05-18 MED ORDER — METHOCARBAMOL 500 MG PO TABS: 500.0000 mg | ORAL_TABLET | Freq: Three times a day (TID) | ORAL | Status: DC | PRN

## 2020-05-18 NOTE — Discharge Summary (Signed)
Physician Discharge Summary  Patient ID: Todd Jordan MRN: [id number] DOB/AGE: 25/02/1996 25 y.o.  Admit date: 05/12/2020 Discharge date: 05/18/2020  Admission Diagnoses: Sigmoid volvulus  Discharge Diagnoses:  Active Problems:   Status post surgery   Sigmoid volvulus [facility])   Discharged Condition: stable  Hospital Course: The patient presented with acute abdominal pain. CT scan showed sigmoid volvulus with concern for bowel compromise. He was admitted and taken to the OR emergently on 12/27 for exploratory laparotomy and was found to have sigmoid volvulus with a gangrenous sigmoid colon. He underwent sigmoid colectomy and end colostomy creation. Postoperatively he was admitted to the progressive care unit. NG tube was removed on POD1. Wound care was consulted for ostomy teaching. His diet was advanced over the next few days, which he tolerated without difficulty. POD4 his colostomy was functioning. By POD5 he was tolerating a diet, colostomy was functioning, pain was controlled and he was ambulating. He was examined and deemed appropriate for discharge home.  Consults: Wound care  Significant Diagnostic Studies: CT abd/pelvis 12/27: sigmoid volvulus with concern for ischemia  Treatments: surgery: exploratory laparotomy, sigmoid resection, end colostomy  Discharge Exam: Blood pressure 133/76, pulse (!) 103, temperature 100.2 F (37.9 C), temperature source Oral, resp. rate 16, height 6' 1.62" (1.87 m), weight 74.8 kg, SpO2 96 %. General appearance: alert, cooperative and appears stated age Neck: no tracheal deviation Resp: normal work of breathing GI: soft, non-tender; bowel sounds normal; no masses,  no organomegaly Incision/Wound: midline incision c/d/i. LLQ colostomy pink and productive of stool.  Disposition: Discharge disposition: 01-Home or Self Care        Allergies as of 05/18/2020   No Known Allergies     Medication List    TAKE these medications   acetaminophen  500 MG tablet Commonly known as: TYLENOL You can take 1000 mg of Tylenol/acetaminophen every 6 hours as needed for pain.  You can alternate with ibuprofen.  Do not take more than 4000 mg of Tylenol/acetaminophen in any 24 hours period.  It can harm your liver.  You can buy over the counter at any drug store.   diphenhydrAMINE 25 MG tablet Commonly known as: BENADRYL Take 25 mg by mouth every 6 (six) hours as needed for itching.   ibuprofen 200 MG tablet Commonly known as: ADVIL You can take 2-3 tablets every 6 hours as needed for pain.  You can alternate with Tylenol/acetaminophen as needed for pain.  Use these medicines first for pain along with the Robaxin.  You can buy this over the counter at any drug store.   methocarbamol 500 MG tablet Commonly known as: ROBAXIN Take 1 tablet (500 mg total) by mouth every 8 (eight) hours as needed for muscle spasms (use as needed every 8 hours for abdominal pain.).   oxyCODONE 5 MG immediate release tablet Commonly known as: Oxy IR/ROXICODONE You can take one every 4 hours as needed for pain not relieved by Tylenol, ibuprofen, and Robaxin.       Follow-up Information    Diamantina Monks, MD. Go on 06/13/2020.   Specialty: Surgery Why: 920. Please arrive 30 minutes prior to your appointment for paperwork. Please bring a copy of your photo ID and insurance card. Please bring someone  Contact information: [address] [place] [address] [phone]        [facility], [place]. Go on 05/24/2020.   Specialty: General Surgery Why: @ 2pm. This is a nurse visit for staple removal. Please  arrive 30 minutes prior to your appointment for paperwork. Please bring a copy of your photo ID and insurance card. Please bring someone  Contact information: [address] [place] [address] [phone]              Signed: Fritzi Mandes 05/18/2020, 11:40 PM

## 2020-05-18 NOTE — Discharge Summary (Incomplete)
Physician Discharge Summary  Patient ID: Todd Jordan MRN: [id number] DOB/AGE: 25/02/1996 24 y.o.  Admit date: 05/12/2020 Discharge date: 05/18/2020  Admission Diagnoses: Sigmoid volvulus  Discharge Diagnoses:  Active Problems:   Status post surgery   Sigmoid volvulus [facility])   Discharged Condition: stable  Hospital Course: The patient presented with acute abdominal pain. CT scan showed sigmoid volvulus with concern for bowel compromise. He was admitted and taken to the OR emergently on 12/27 for exploratory laparotomy and was found to have sigmoid volvulus with a gangrenous sigmoid colon. He underwent sigmoid colectomy and end colostomy creation. Postoperatively he was admitted to the progressive care unit. NG tube was removed on POD1. Wound care was consulted for ostomy teaching. His diet was advanced over the next few days, which he tolerated without difficulty. POD4 his colostomy was functioning. By POD5 he was tolerating a diet, colostomy was functioning, pain was controlled and he was ambulating. He was examined and deemed appropriate for discharge home.  Consults: Wound care  Significant Diagnostic Studies: {diagnostics:18242}  Treatments: {Tx:18249}  Discharge Exam: Blood pressure 133/76, pulse (!) 103, temperature 100.2 F (37.9 C), temperature source Oral, resp. rate 16, height 6' 1.62" (1.87 m), weight 74.8 kg, SpO2 96 %. {physical JJKK:[id number]}  Disposition: Discharge disposition: 01-Home or Self Care        Allergies as of 05/18/2020   No Known Allergies     Medication List    TAKE these medications   acetaminophen 500 MG tablet Commonly known as: TYLENOL You can take 1000 mg of Tylenol/acetaminophen every 6 hours as needed for pain.  You can alternate with ibuprofen.  Do not take more than 4000 mg of Tylenol/acetaminophen in any 24 hours period.  It can harm your liver.  You can buy over the counter at any drug store.   diphenhydrAMINE 25 MG tablet Commonly  known as: BENADRYL Take 25 mg by mouth every 6 (six) hours as needed for itching.   ibuprofen 200 MG tablet Commonly known as: ADVIL You can take 2-3 tablets every 6 hours as needed for pain.  You can alternate with Tylenol/acetaminophen as needed for pain.  Use these medicines first for pain along with the Robaxin.  You can buy this over the counter at any drug store.   methocarbamol 500 MG tablet Commonly known as: ROBAXIN Take 1 tablet (500 mg total) by mouth every 8 (eight) hours as needed for muscle spasms (use as needed every 8 hours for abdominal pain.).   oxyCODONE 5 MG immediate release tablet Commonly known as: Oxy IR/ROXICODONE You can take one every 4 hours as needed for pain not relieved by Tylenol, ibuprofen, and Robaxin.       Follow-up Information    Diamantina Monks, MD. Go on 06/13/2020.   Specialty: Surgery Why: 920. Please arrive 30 minutes prior to your appointment for paperwork. Please bring a copy of your photo ID and insurance card. Please bring someone  Contact information: [address] [place] [address] [phone]        [facility], [place]. Go on 05/24/2020.   Specialty: General Surgery Why: @ 2pm. This is a nurse visit for staple removal. Please arrive 30 minutes prior to your appointment for paperwork. Please bring a copy of your photo ID and insurance card. Please bring someone  Contact information: [address] [place] [address] [phone]              Signed:  Fritzi Mandes 05/18/2020, 11:40 PM

## 2020-05-18 NOTE — Progress Notes (Signed)
Pt discharged home. This RN spoke with Sherrie George, PA and was given verbal orders to help pt shower, verify wound care nurse education on ostomy, and verify pt is able to get medications for home.   Pt assisted to the shower, with his ostomy covered with 2 ABD pads and tape. Pt showered independently. His PIV was removed and all personal belongings, extra hygiene supplies, and extra ostomy supplies were packed.   This RN went into detailed description of hospital summary, medication list, medication pick-up, follow-up appointments, and ostomy care with pt via interpreter. Pt educated on Oxycodone use and risks. Pt and his brother both present for education and expressed understanding multiple times with no additional questions asked.   Wound care nurse, TOC RN, and discharging MD all separately informed this RN that wound care would not be following up with pt prior to discharge. This RN verified ostomy care, emptying, and replacement with pt and sent him home with extra supplies.  Pt was escorted to private vehicle in wheelchair with brother, father, and all belongings present.   Robina Ade, RN

## 2020-05-18 NOTE — TOC Transition Note (Signed)
Transition of Care [facility]) - CM/SW Discharge Note   Patient Details  Name: Andros Channing MRN: [id number] Date of Birth: 03/27/1996  [facility] CM/SW Contact:  Lawerance Sabal, RN Phone Number: 05/18/2020, 2:13 PM   Clinical Narrative:    Per WOC: "Education provided: opening/closing, emptying, removing, sizing new pouch opening, cleaning around stoma, use of barrier ring, frequency of changing and emptying.  Enrolled patient in [place] Secure Start Discharge program: Yes, previously. Mother's brother in law can read and speak both English and Arabic and can help them with interacting with [employer] stores after discharge. Supplies in room. ot follow at this time. "  Patient enrolled in [place] [facility] program 12/28.  [facility] is [facility] provider, they declined to cover. No other HH provider available for patient.  Instructed nurse to send home with extra supplies.  Provided Good Rx coupon for medications.      Final next level of care: Home/Self Care Barriers to Discharge: No Barriers Identified   Patient Goals and CMS Choice        Discharge Placement                       Discharge Plan and Services                                     Social Determinants of Health (SDOH) Interventions     Readmission Risk Interventions No flowsheet data found.

## 2020-05-18 NOTE — Progress Notes (Signed)
5 Days Post-Op    SF:[id number] pain  Subjective: Doing well with a regular diet, he says he can do the colostomy, but has not changed it yet.  Midline wound and ostomy look good.    Objective: Vital signs in last 24 hours: Temp:  [98.1 F (36.7 C)-99.3 F (37.4 C)] 98.8 F (37.1 C) (01/01 1053) Pulse Rate:  [50-78] 64 (01/01 1053) Resp:  [10-16] 16 (01/01 1053) BP: (113-133)/(72-92) 121/80 (01/01 1053) SpO2:  [99 %-100 %] 100 % (01/01 1053) Last BM Date: 05/17/20 Nothing pO recorded 4279 IV Urine 700 Stool, "5" recorded Afebrile, VSS No labs today No imaging  Intake/Output from previous day: 12/31 0701 - 01/01 0700 In: 4279.6 [I.V.:3579.7; IV Piggyback:699.9] Out: 705 [Urine:700; Stool:5] Intake/Output this shift: Total I/O In: -  Out: 950 [Urine:950]  General appearance: alert, cooperative and no distress Resp: clear to auscultation bilaterally GI: soft, sore, midline incision look fine.  ostomy is pink and working.   Extremities: extremities normal, atraumatic, no cyanosis or edema  Lab Results:  Recent Labs    05/16/20 0335 05/17/20 0410  WBC 11.1* 13.9*  HGB 11.5* 11.0*  HCT 32.2* 32.6*  PLT 156 192    BMET Recent Labs    05/16/20 0335 05/17/20 0410  NA 137 137  K 3.2* 3.8  CL 103 103  CO2 25 23  GLUCOSE 105* 91  BUN 6 10  CREATININE 0.83 0.83  CALCIUM 8.4* 8.5*   PT/INR No results for input(s): LABPROT, INR in the last 72 hours.  Recent Labs  Lab 05/12/20 2207  AST 26  ALT 22  ALKPHOS 54  BILITOT 0.8  PROT 7.7  ALBUMIN 4.4     Lipase     Component Value Date/Time   LIPASE 17 05/12/2020 2207     Medications: . docusate sodium  100 mg Oral BID  . enoxaparin (LOVENOX) injection  40 mg Subcutaneous Q24H  . polyethylene glycol  17 g Oral Daily  Path A. 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 Bedelia Person - surgical path as noted below  - WOC following for new ostomy -Passing stool, advance to regular diet - Mobilize. Cleared by PT - Pulm toilet  - Some chest pain overnight, troponin negative, low suspicion for cardiac issue this young gentleman  If he does well with regular diet today, plan for discharge tomorrow, only thing that may hold up discharge is his persistent leukocytosis  ID -fever overnight, possibly atelectasis. [id number]. CXR today.  FEN -regular diet VTE -SCDs, lovenox Foley -none Follow up -Dr. Bedelia Person   Plan:  We will ask staff to help him shower and redo the colostomy here.  I have ask TOC to see him and see if we can get him some home health assistance and be sure he has money for medicines.    I will get the office to call and set him up for staple removal next week and I told them to call if they do not hear by Wednesday.  We will send him home on Tylenol, ibuprofen, robaxin, and oxycodone.      LOS: 5 days  Maryama Kuriakose 05/18/2020 Please see Amion

## 2020-05-18 NOTE — Discharge Instructions (Signed)
[facility]      [facility] [phone]  OPEN ABDOMINAL SURGERY: POST OP INSTRUCTIONS  Always review your discharge instruction sheet given to you by the facility where your surgery was performed.  IF YOU HAVE DISABILITY OR FAMILY LEAVE FORMS, YOU MUST BRING THEM TO THE OFFICE FOR PROCESSING.  PLEASE DO NOT GIVE THEM TO YOUR DOCTOR.  1. A prescription for pain medication may be given to you upon discharge.  Take your pain medication as prescribed, if needed.  If narcotic pain medicine is not needed, then you may take acetaminophen (Tylenol) or ibuprofen (Advil) as needed. 2. Take your usually prescribed medications unless otherwise directed. 3. If you need a refill on your pain medication, please contact your pharmacy. They will contact our office to request authorization.  Prescriptions will not be filled after 5pm or on week-ends. 4. You should follow a light diet the first few days after arrival home, such as soup and crackers, pudding, etc.unless your doctor has advised otherwise. A high-fiber, low fat diet can be resumed as tolerated.   Be sure to include lots of fluids daily. Most patients will experience some swelling and bruising on the chest and neck area.  Ice packs will help.  Swelling and bruising can take several days to resolve 5. Most patients will experience some swelling and bruising in the area of the incision. Ice pack will help. Swelling and bruising can take several days to resolve..  6. It is common to experience some constipation if taking pain medication after surgery.  Increasing fluid intake and taking a stool softener will usually help or prevent this problem from occurring.  A mild laxative (Milk of Magnesia or Miralax) should be taken according to package directions if there are no bowel movements after 48 hours. 7.  You may have steri-strips (small skin tapes) in place directly over the incision.  These strips should be left on the skin for 7-10 days.  If your  surgeon used skin glue on the incision, you may shower in 24 hours.  The glue will flake off over the next 2-3 weeks.  Any sutures or staples will be removed at the office during your follow-up visit. You may find that a light gauze bandage over your incision may keep your staples from being rubbed or pulled. You may shower and replace the bandage daily. 8. ACTIVITIES:  You may resume regular (light) daily activities beginning the next day--such as daily self-care, walking, climbing stairs--gradually increasing activities as tolerated.  You may have sexual intercourse when it is comfortable.  Refrain from any heavy lifting or straining until approved by your doctor. a. You may drive when you no longer are taking prescription pain medication, you can comfortably wear a seatbelt, and you can safely maneuver your car and apply brakes b. Return to Work: ___________________________________ 9. You should see your doctor in the office for a follow-up appointment approximately two weeks after your surgery.  Make sure that you call for this appointment within a day or two after you arrive home to insure a convenient appointment time. OTHER INSTRUCTIONS:  _____________________________________________________________ _____________________________________________________________  WHEN TO CALL YOUR DOCTOR: 1. Fever over 101.0 2. Inability to urinate 3. Nausea and/or vomiting 4. Extreme swelling or bruising 5. Continued bleeding from incision. 6. Increased pain, redness, or drainage from the incision. 7. Difficulty swallowing or breathing 8. Muscle cramping or spasms. 9. Numbness or tingling in hands or feet or around lips.  The clinic staff is available to   answer your questions during regular business hours.  Please don't hesitate to call and ask to speak to one of the nurses if you have concerns.  For further questions, please visit [email or web address]   Volvulus  Volvulus is an abnormal  twisting of a portion of the digestive tract. The digestive tract begins with the part of the body that moves food from your mouth to your stomach (esophagus). It also includes the stomach, small intestine, and large intestine. With volvulus, the twisting can block the flow of digestion (bowel obstruction). It can also block the flow of blood to the part of the digestive tract that is twisted. Lack of blood flow can cause the twisted part of the digestive tract to die. Volvulus is a medical emergency. There are various types of volvulus:  Sigmoid volvulus is a twisting of the last part of the large intestine. This is the most common type.  Midgut volvulus usually occurs in children who are born with an abnormally positioned small intestine (malrotation).  Cecal volvulus may be caused by scar tissue from previous abdominal surgery.  Gastric volvulus is a rare type of volvulus that occurs when the stomach twists around itself. What are the causes? This condition may be caused by many different things. It can be something a person is born with (congenital deformity), or it may be a problem that develops from another condition. What increases the risk? You are more likely to develop this condition if you:  Are 28 years old or older.  Have long-term (chronic) constipation.  Have part of your stomach located above the area where the stomach and esophagus meet (paraesophageal hernia).  Are bedridden.  Have had previous abdominal surgery.  Live in a long-term care facility. What are the signs or symptoms? Symptoms of most types of volvulus include:  Abdominal pain. ? Sigmoid volvulus may cause pain in the lower left part of the abdomen. ? Cecal volvulus may cause pain in the lower right part of the abdomen. ? Gastric and midgut volvulus may cause pain in the upper abdomen.  Bloating and swelling of the abdomen.  Decreased passing of gas or inability to pass  gas.  Nausea.  Vomiting.  Constipation.  Tenderness when pressing on the abdomen. As the condition gets worse, the volvulus can develop a hole (perforation) and leak digestive contents into the abdomen. This can cause late signs of volvulus, including:  Severe infection (sepsis).  Bleeding into the abdomen.  Very low blood pressure (shock). How is this diagnosed? This condition may be diagnosed based on:  Your symptoms. The health care provider may suspect volvulus if you have sudden symptoms of intestinal obstruction.  A physical exam. During the exam, the health care provider will listen to your abdomen for the sounds of digestion and will feel your abdomen for tenderness.  Imaging studies of your abdomen, such as: ? CT scan. This is the best imaging study for diagnosing volvulus. ? Plain X-rays. These may show air and fluid levels and widening above the obstruction. ? Ultrasound. How is this treated? This condition is almost always a medical emergency that requires surgery right away. Options include:  Emergency colonoscopy. A lubricated, flexible tube that has a camera on the end of it is inserted into the anus and then passed into the rectum, colon, and other parts of the large intestine. If your volvulus is in the large intestine, this procedure may be an option for untwisting it.  An abdominal surgery to untwist the volvulus.  If your volvulus cannot be untwisted, you may need to have part of the digestive tract removed (resection). Follow these instructions at home:  Follow instructions from your health care provider about recovery after your procedure.  Get plenty of rest.  Follow instructions about eating restrictions. You may need to avoid solid foods and consume only clear liquids until your condition improves.  Take over-the-counter and prescription medicines only as told by your health care provider.  Keep all follow-up visits as told by your health care  provider. This is important. Get help right away if:  You have increased pain or cramping.  You have a fever.  Your abdomen is swollen.  You have nausea or vomiting.  You have blood in your stool or vomit. Summary  Volvulus is an abnormal twisting of a portion of the digestive tract.  Lack of blood flow can cause the twisted part of the digestive tract to die.  Intense abdominal pain, nausea, vomiting, and an inability to pass gas are symptoms of volvulus.  Volvulus is a medical emergency. It usually requires surgery right away. This information is not intended to replace advice given to you by your health care provider. Make sure you discuss any questions you have with your health care provider. Document Revised: 08/11/2017 Document Reviewed: 03/31/2017 Elsevier Patient Education  2020 Elsevier Inc.    Colostomy Home Guide, Adult  Colostomy surgery is done to create an opening in the front of the abdomen for stool (feces) to leave the body through an ostomy (stoma). Part of the large intestine is attached to the stoma. A bag, also called a pouch, is fitted over the stoma. Stool and gas will collect in the bag. After surgery, you will need to empty and change your colostomy bag as needed. You will also need to care for your stoma. How to care for the stoma Your stoma should look pink, red, and moist, like the inside of your cheek. Soon after surgery, the stoma may be swollen, but this swelling will go away within 6 weeks. To care for the stoma:  Keep the skin around the stoma clean and dry.  Use a clean, soft washcloth to gently wash the stoma and the skin around it. Clean using a circular motion, and wipe away from the stoma opening, not toward it. ? Use warm water and only use cleansers recommended by your health care provider. ? Rinse the stoma area with plain water. ? Dry the area around the stoma well.  Use stoma powder or ointment on your skin only as told by your  health care provider. Do not use any other powders, gels, wipes, or creams on the skin around the stoma.  Check the stoma area every day for signs of infection. Check for: ? New or worsening redness, swelling, or pain. ? New or increased fluid or blood. ? Pus or warmth.  Measure the stoma opening regularly and record the size. Watch for changes. (It is normal for the stoma to get smaller as swelling goes away.) Share this information with your health care provider. How to empty the colostomy bag  Empty your bag at bedtime and whenever it is one-third to one-half full. Do not let the bag get more than half-full with stool or gas. The bag could leak if it gets too full. Some colostomy bags have a built-in gas release valve that releases gas often throughout the day. Follow these basic steps: 1. Wash your hands with soap and water. 2. Sit  far back on the toilet seat. 3. Put several pieces of toilet paper into the toilet water. This will prevent splashing as you empty stool into the toilet. 4. Remove the clip or the hook-and-loop fastener from the tail end of the bag. 5. Unroll the tail, then empty the stool into the toilet. 6. Clean the tail with toilet paper or a moist towelette. 7. Reroll the tail, and close it with the clip or the hook-and-loop fastener. 8. Wash your hands again. How to change the colostomy bag Change your bag every 3-4 days or as often as told by your health care provider. Also change the bag if it is leaking or separating from the skin, or if your skin around the stoma looks or feels irritated. Irritated skin may be a sign that the bag is leaking. Always have colostomy supplies with you, and follow these basic steps: 1. Wash your hands with soap and water. Have paper towels or tissues nearby to clean any discharge. 2. Remove the old bag and skin barrier. Use your fingers or a warm cloth to gently push the skin away from the barrier. 3. Clean the stoma area with water or  with mild soap and water, as directed. Use water to rinse away any soap. 4. Dry the skin. You may use the cool setting on a hair dryer to do this. 5. Use a tracing pattern (template) to cut the skin barrier to the size needed. 6. If you are using a two-piece bag, attach the bag and the skin barrier to each other. Add the barrier ring, if you use one. 7. If directed, apply stoma powder or skin barrier gel to the skin. 8. Warm the skin barrier with your hands, or blow with a hair dryer for 5-10 seconds. 9. Remove the paper from the adhesive strip of the skin barrier. 10. Press the adhesive strip onto the skin around the stoma. 11. Gently rub the skin barrier onto the skin. This creates heat that helps the barrier to stick. 12. Apply stoma tape to the edges of the skin barrier, if desired. 13. Wash your hands again. General recommendations  Avoid wearing tight clothes or having anything press directly on your stoma or bag. Change your clothing whenever it is soiled or damp.  You may shower or bathe with the bag on or off. Do not use harsh or oily soaps or lotions. Dry the skin and bag after bathing.  Store all supplies in a cool, dry place. Do not leave supplies in extreme heat because some parts can melt or not stick as well.  Whenever you leave home, take extra clothing and an extra skin barrier and bag with you.  If your bag gets wet, you can dry it with a hair dryer on the cool setting.  To prevent odor, you may put drops of ostomy deodorizer in the bag.  If recommended by your health care provider, put ostomy lubricant inside the bag. This helps stool to slide out of the bag more easily and completely. Contact a health care provider if:  You have new or worsening redness, swelling, or pain around your stoma.  You have new or increased fluid or blood coming from your stoma.  Your stoma feels warm to the touch.  You have pus coming from your stoma.  Your stoma extends in or out  farther than normal.  You need to change your bag every day.  You have a fever. Get help right away if:  Your  stool is bloody.  You have nausea or you vomit.  You have trouble breathing. Summary  Measure your stoma opening regularly and record the size. Watch for changes.  Empty your bag at bedtime and whenever it is one-third to one-half full. Do not let the bag get more than half-full with stool or gas.  Change your bag every 3-4 days or as often as told by your health care provider.  Whenever you leave home, take extra clothing and an extra skin barrier and bag with you. This information is not intended to replace advice given to you by your health care provider. Make sure you discuss any questions you have with your health care provider. Document Revised: 08/24/2018 Document Reviewed: 10/28/2016 Elsevier Patient Education  2020 [employer].

## 2020-05-19 NOTE — Care Management (Signed)
  [facility] Medication Assistance Card Name: Seymore Brodowski ID (MRN): [id number] Bin: [id number] RX Group: [id number] Discharge Date: 05/19/2020 Expiration Date: 05/31/2020                                           (must be filled within 7 days of discharge)  Dear   : Delight Ovens  You have been approved to have the prescriptions written by your discharging physician filled through our [facility] [place] (Medication Assistance Through [facility]) program. This program allows for a one-time (no refills) 34-day supply of selected medications for a low copay amount.  The copay is $3.00 per prescription. For instance, if you have one prescription, you will pay $3.00; for two prescriptions, you pay $6.00; for three prescriptions, you pay $9.00; and so on.  Only certain pharmacies are participating in this program with [facility]. You will need to select one of the pharmacies from the attached list and take your prescriptions, this letter, and your photo ID to one of the participating pharmacies.   We are excited that you are able to use the [facility] program to get your medications. These prescriptions must be filled within 7 days of hospital discharge or they will no longer be valid for the [facility] program. Should you have any problems with your prescriptions please contact your case management team member at [phone] for [place].  Thank you, [facility] Care Management

## 2020-08-13 ENCOUNTER — Encounter (HOSPITAL_COMMUNITY): Payer: Self-pay | Admitting: Surgery

## 2020-08-13 ENCOUNTER — Ambulatory Visit (INDEPENDENT_AMBULATORY_CARE_PROVIDER_SITE_OTHER): Payer: Medicaid Other | Admitting: Family Medicine

## 2020-08-13 ENCOUNTER — Other Ambulatory Visit: Payer: Self-pay

## 2020-08-13 ENCOUNTER — Encounter: Payer: Self-pay | Admitting: Family Medicine

## 2020-08-13 VITALS — BP 110/74 | HR 97 | Wt 146.0 lb

## 2020-08-13 DIAGNOSIS — Z7689 Persons encountering health services in other specified circumstances: Secondary | ICD-10-CM

## 2020-08-13 DIAGNOSIS — K001 Supernumerary teeth: Secondary | ICD-10-CM | POA: Diagnosis not present

## 2020-08-13 DIAGNOSIS — Z9049 Acquired absence of other specified parts of digestive tract: Secondary | ICD-10-CM

## 2020-08-13 NOTE — Progress Notes (Signed)
    SUBJECTIVE:   CHIEF COMPLAINT / HPI: Establish Care  Patient recently came over from [place] in December as refugee.  Indicated came here to establish care.  Had partial colectomy after injury in December while playing soccer.  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  Patient indicates was told to have follow-up visit for removal of colostomy bag, but unsure of when appointment is supposed to be.  Initially did not have records of this as was in separate chart with misspelling of patient's last name.  Inicates cleaning and changes colostomy bag every 3 days and endorses only mild periumbilical pain occasionally away from the bag.  Also complains of extra tooth.  Indicates there is no pain associated with or other issue but still requesting removal.   PERTINENT  PMH / PSH: Partial Colectomy  OBJECTIVE:   BP 110/74   Pulse 97   Wt 146 lb (66.2 kg)   SpO2 98%   BMI 18.94 kg/m    Physical Exam HENT:     Head: Normocephalic and atraumatic.     Nose: Nose normal.     Mouth/Throat:     Mouth: Mucous membranes are moist.     Pharynx: No oropharyngeal exudate or posterior oropharyngeal erythema.     Comments: Supernumary canine posterior upper right canine  Eyes:     Pupils: Pupils are equal, round, and reactive to light.  Cardiovascular:     Rate and Rhythm: Normal rate and regular rhythm.     Pulses: Normal pulses.     Heart sounds: Normal heart sounds.  Pulmonary:     Effort: Pulmonary effort is normal.     Breath sounds: Normal breath sounds.     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  Currently has colostomy bag, changes every 3 days and denies any blood.  Small amount of intermittent periumbilical tenderness.  Well healed surgical scar, and no peritoneal findings or signs of infection or dehiscence.  Nurse spoke on phone with [facility].  Patient has  follow-up scheduled for colostomy reversal in June. 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  No signs of interference with normal tooth growth and patient has no complaints of pain, swelling or bleeding or other cause for emergent surgery, but does wish to have tooth removed. - follow up with [facility] Maintenance/Establish Care - Obtain CMP, HIV, and Hep C - follow up in 1 month  Jovita Kussmaul, MD [facility]

## 2020-08-13 NOTE — Patient Instructions (Addendum)
It was good to see you today Todd Jordan.  Thank you for coming in.  You will go see [facility] on June 2nd   You can also bee seen at [facility] about your extra tooth.  [phone]   I am obtaining labs today and will call with any abnormal results.  I would also like to see you in 1 month as well on April 25th.  Be Well, Dr Pecola Leisure

## 2020-08-14 ENCOUNTER — Encounter: Payer: Self-pay | Admitting: Family Medicine

## 2020-08-14 DIAGNOSIS — K001 Supernumerary teeth: Secondary | ICD-10-CM | POA: Insufficient documentation

## 2020-08-14 DIAGNOSIS — Z9049 Acquired absence of other specified parts of digestive tract: Secondary | ICD-10-CM | POA: Insufficient documentation

## 2020-08-14 LAB — COMPREHENSIVE METABOLIC PANEL
ALT: 16 IU/L (ref 0–44)
AST: 19 IU/L (ref 0–40)
Albumin/Globulin Ratio: 1.4 (ref 1.2–2.2)
Albumin: 4.7 g/dL (ref 4.1–5.2)
Alkaline Phosphatase: 71 IU/L (ref 44–121)
BUN/Creatinine Ratio: 13 (ref 9–20)
BUN: 13 mg/dL (ref 6–20)
Bilirubin Total: 0.4 mg/dL (ref 0.0–1.2)
CO2: 22 mmol/L (ref 20–29)
Calcium: 10 mg/dL (ref 8.7–10.2)
Chloride: 100 mmol/L (ref 96–106)
Creatinine, Ser: 1 mg/dL (ref 0.76–1.27)
Globulin, Total: 3.3 g/dL (ref 1.5–4.5)
Glucose: 109 mg/dL — ABNORMAL HIGH (ref 65–99)
Potassium: 4.6 mmol/L (ref 3.5–5.2)
Sodium: 140 mmol/L (ref 134–144)
Total Protein: 8 g/dL (ref 6.0–8.5)
eGFR: 108 mL/min/{1.73_m2} (ref >59)

## 2020-08-14 LAB — HIV ANTIBODY (ROUTINE TESTING W REFLEX): HIV Screen 4th Generation wRfx: NONREACTIVE

## 2020-08-14 LAB — HEPATITIS C ANTIBODY: Hep C Virus Ab: <0.1 s/co ratio (ref 0.0–0.9)

## 2020-08-14 NOTE — Assessment & Plan Note (Addendum)
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  Currently has colostomy bag, changes every 3 days and denies any blood.  Small amount of intermittent periumbilical tenderness.  Well healed surgical scar, and no peritoneal findings or signs of infection or dehiscence.  Nurse spoke on phone with [facility].  Patient has follow-up scheduled for colostomy reversal in June. - Merged both of patient's charts - follow up with Surgery in June

## 2020-08-14 NOTE — Assessment & Plan Note (Signed)
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  No signs of interference with normal tooth growth and patient has no complaints of pain, swelling or bleeding or other cause for emergent surgery, but does wish to have tooth removed. - follow up with [facility]

## 2020-09-06 DIAGNOSIS — Z0289 Encounter for other administrative examinations: Secondary | ICD-10-CM | POA: Insufficient documentation

## 2020-09-09 ENCOUNTER — Ambulatory Visit: Payer: Medicaid Other | Admitting: Family Medicine

## 2020-09-09 DIAGNOSIS — Z0289 Encounter for other administrative examinations: Secondary | ICD-10-CM

## 2020-12-04 ENCOUNTER — Other Ambulatory Visit: Payer: Self-pay | Admitting: Surgery

## 2020-12-05 ENCOUNTER — Other Ambulatory Visit: Payer: Self-pay | Admitting: Surgery

## 2020-12-05 DIAGNOSIS — Z933 Colostomy status: Secondary | ICD-10-CM

## 2020-12-10 ENCOUNTER — Inpatient Hospital Stay: Admission: RE | Admit: 2020-12-10 | Payer: Medicaid Other | Source: Ambulatory Visit

## 2021-06-10 ENCOUNTER — Ambulatory Visit: Payer: Medicaid Other | Admitting: Gastroenterology

## 2021-08-01 ENCOUNTER — Ambulatory Visit
Admission: RE | Admit: 2021-08-01 | Discharge: 2021-08-01 | Disposition: A | Discharge status: Home / Self Care | Payer: Medicaid Other | Source: Ambulatory Visit | Attending: Surgery | Admitting: Surgery

## 2021-08-01 DIAGNOSIS — Z933 Colostomy status: Secondary | ICD-10-CM

## 2021-08-08 IMAGING — CT CT ABD-PELV W/ CM
2 of 4 series · 15 of 46 positions shown, 17 images · IV contrast (APPLIED)
Comparison: None.

CLINICAL DATA: Right lower quadrant abdominal pain. Nausea and
vomiting. Blood and bowel movement.

EXAM:
CT ABDOMEN AND PELVIS WITH CONTRAST
TECHNIQUE: Multidetector CT imaging of the abdomen and pelvis was performed
using the standard protocol following bolus administration of
intravenous contrast.
CONTRAST:  100mL OMNIPAQUE IOHEXOL 300 MG/ML  SOLN

[Series 3: abdomen 5.0 · axial · 0.73mm/px · z∈[-496,-36]mm · 12 of 106 slices shown, 14 images]
[im 7/106  soft-tissue]
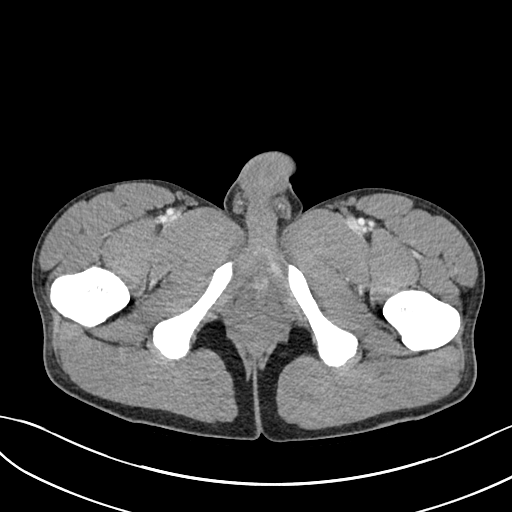
[im 7/106  bone]
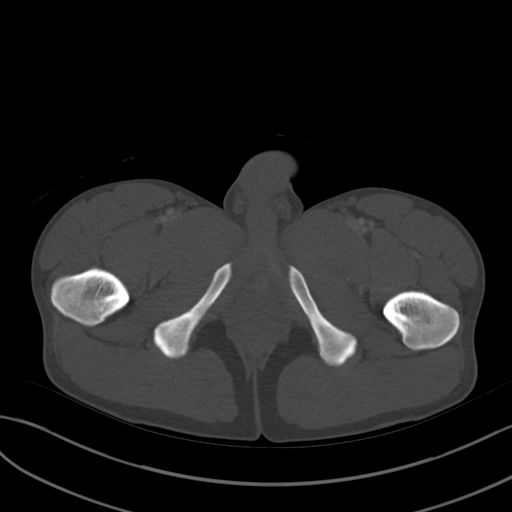
[im 19/106  soft-tissue]
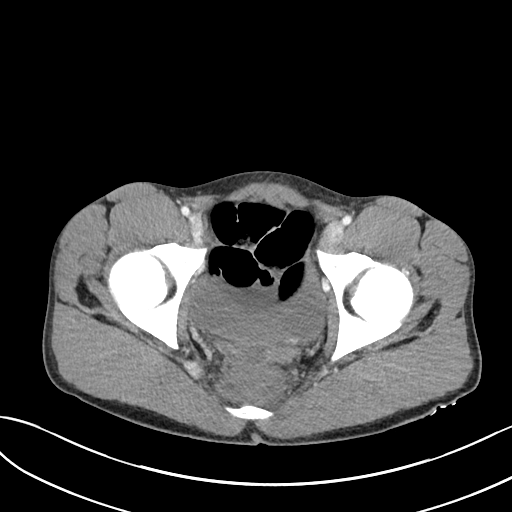
[im 25/106  soft-tissue]
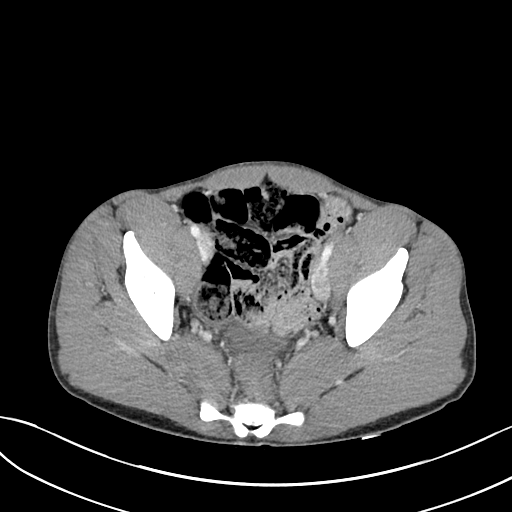
[im 31/106  soft-tissue]
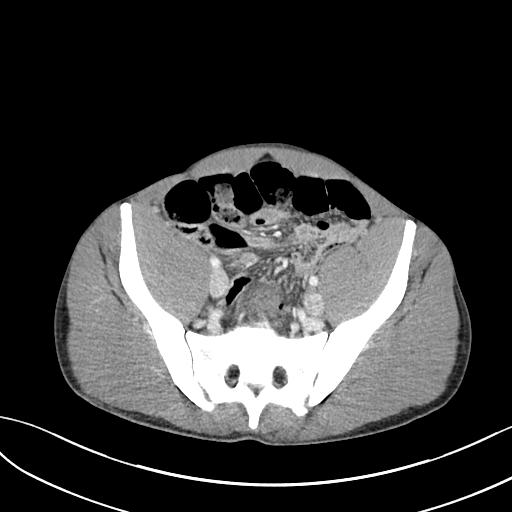
[im 44/106  soft-tissue]
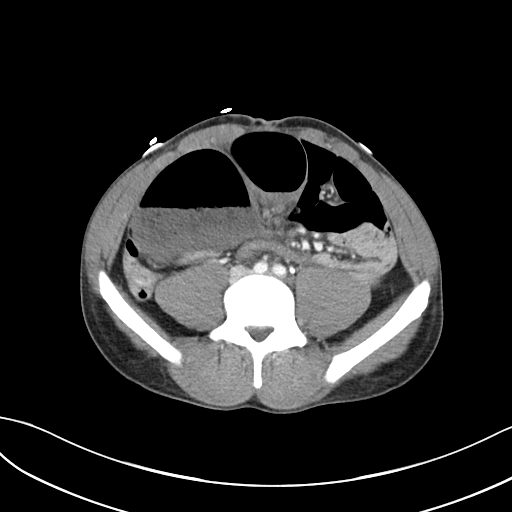
[im 50/106  soft-tissue]
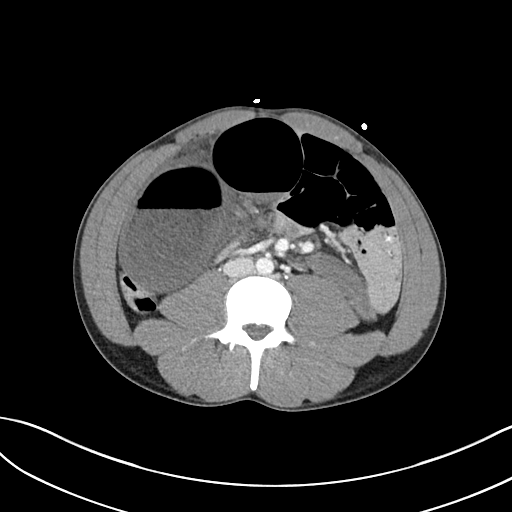
[im 56/106  soft-tissue]
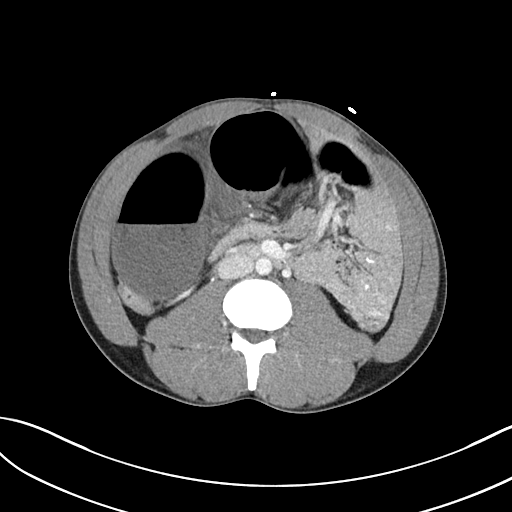
[im 68/106  soft-tissue]
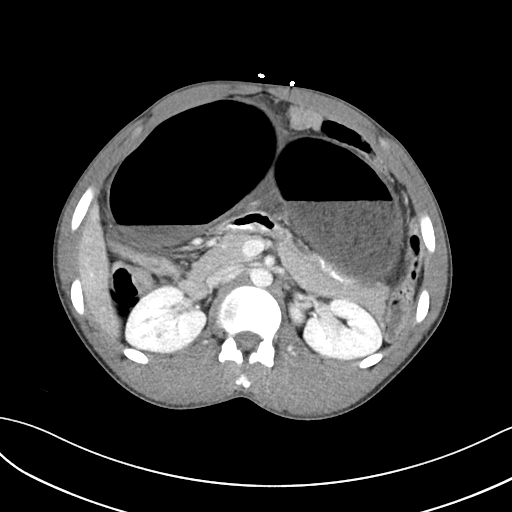
[im 75/106  soft-tissue]
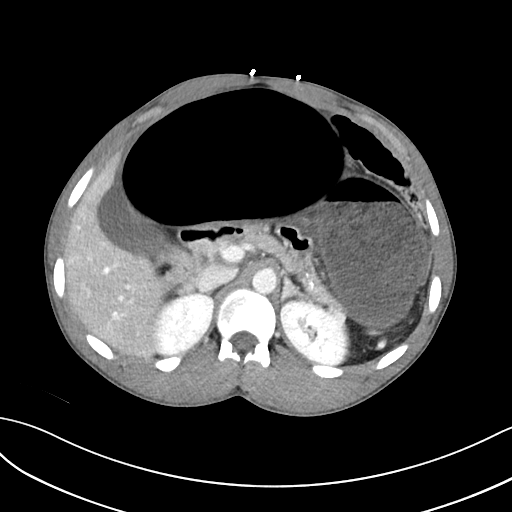
[im 75/106  bone]
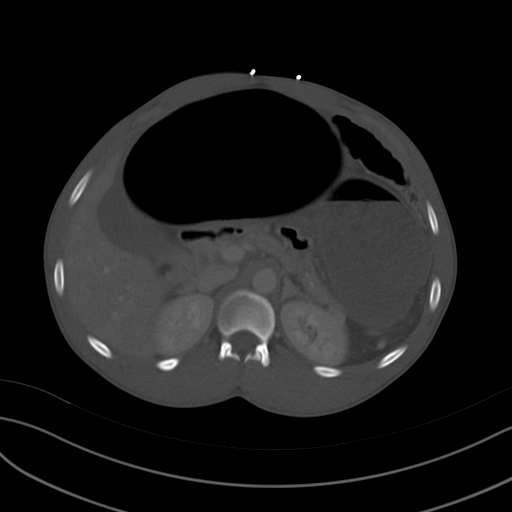
[im 81/106  soft-tissue]
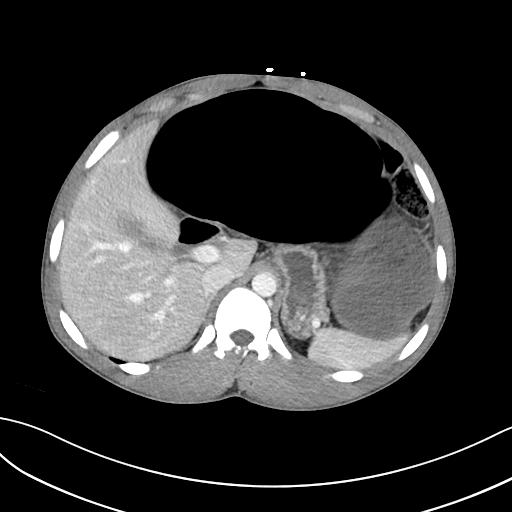
[im 93/106  soft-tissue]
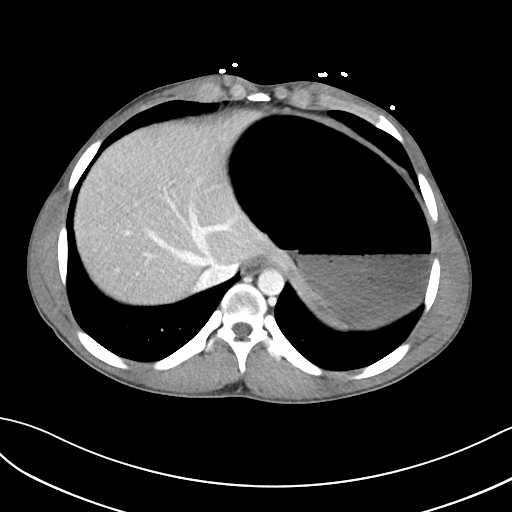
[im 99/106  soft-tissue]
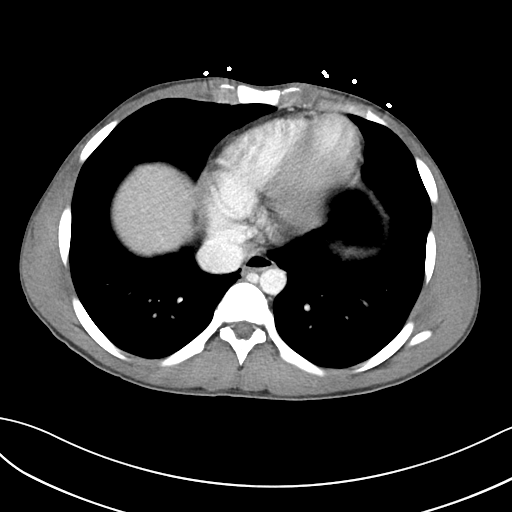

[Series 6: abdomen 3.0 mpr cor · coronal · 0.69mm/px · 3 of 94 slices shown]
[im 32/94  soft-tissue]
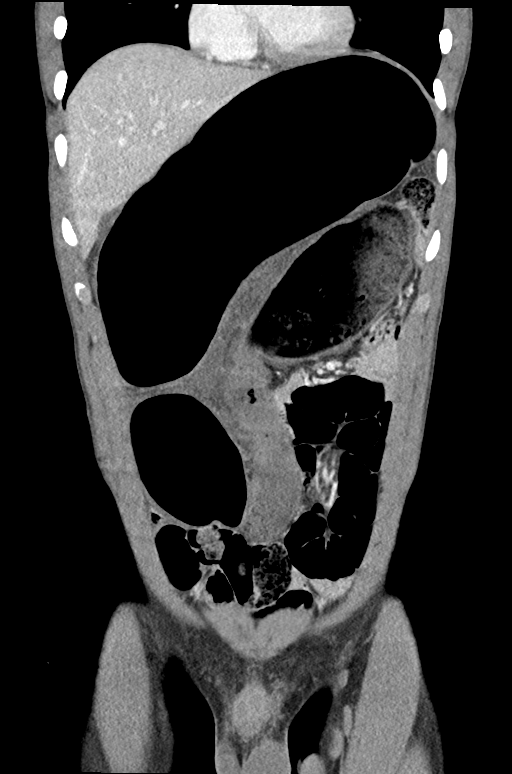
[im 42/94  soft-tissue]
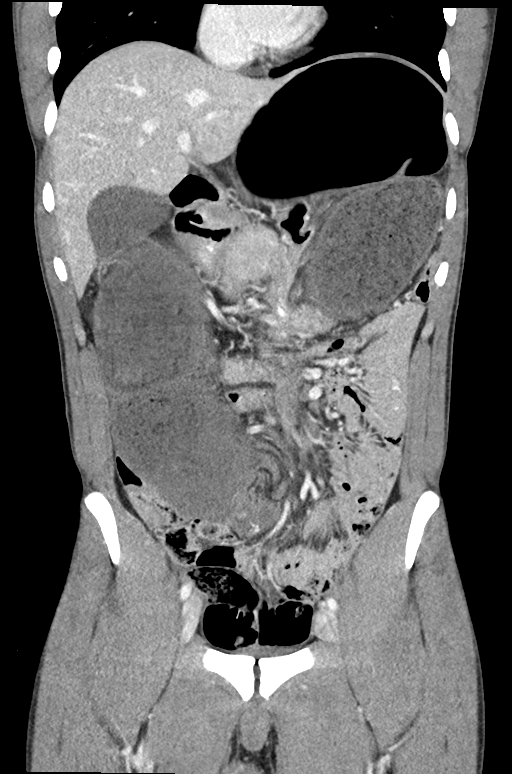
[im 52/94  soft-tissue]
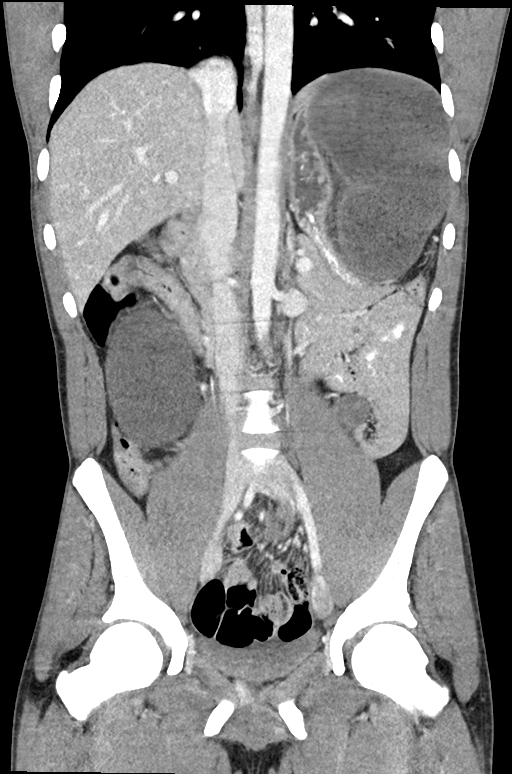

[15 of 46 positions shown; findings below may reference images not displayed]

FINDINGS: Lower chest: The lung bases are clear without focal nodule, mass, or
airspace disease. Heart size is normal. No significant pleural or
pericardial effusion is present.

Hepatobiliary: No focal liver abnormality is seen. No gallstones,
gallbladder wall thickening, or biliary dilatation.

Pancreas: Unremarkable. No pancreatic ductal dilatation or
surrounding inflammatory changes.

Spleen: Normal in size without focal abnormality.

Adrenals/Urinary Tract: Adrenal glands are unremarkable. Kidneys are
normal, without renal calculi, focal lesion, or hydronephrosis.
Bladder is unremarkable.

Stomach/Bowel: The stomach and duodenum are displaced posteriorly.
Small bowel is unremarkable. Ascending and transverse colon are
within normal limits. The descending colon is mostly collapsed.
Markedly dilated loops of the sigmoid colon extend into the left
upper quadrant. Rotation can be seen on coronal image 45. There is
no mucosal enhancement of the sigmoid colon within the volvulus. No
focal wall thickening or pneumatosis is present.

Vascular/Lymphatic: No significant vascular findings are present. No
enlarged abdominal or pelvic lymph nodes.

Reproductive: Prostate is unremarkable.

Other: Free fluid is present within the abdomen pelvis. There is
significant stranding about the sigmoid colon. No free air is
present.

Musculoskeletal: No acute or significant osseous findings.
IMPRESSION: 1. Markedly dilated loops of sigmoid colon extend into the left
upper quadrant, consistent with a sigmoid volvulus. Lack of mucosal
enhancement suggest developing ischemia.
2. Free fluid within the abdomen pelvis is likely reactive. No free
air to suggest perforation.
3. Critical Value/emergent results were called by telephone at the
time of interpretation on 05/13/2020 at [DATE] to provider MOROTE
BALANTANG , who verbally acknowledged these results.

## 2021-12-11 ENCOUNTER — Ambulatory Visit: Payer: Commercial Managed Care - HMO | Attending: Surgery | Admitting: Physical Therapy

## 2021-12-11 ENCOUNTER — Other Ambulatory Visit: Payer: Self-pay

## 2021-12-11 ENCOUNTER — Encounter: Payer: Self-pay | Admitting: Physical Therapy

## 2021-12-11 DIAGNOSIS — M6281 Muscle weakness (generalized): Secondary | ICD-10-CM | POA: Insufficient documentation

## 2021-12-11 DIAGNOSIS — K5902 Outlet dysfunction constipation: Secondary | ICD-10-CM | POA: Diagnosis present

## 2021-12-11 DIAGNOSIS — R279 Unspecified lack of coordination: Secondary | ICD-10-CM | POA: Diagnosis not present

## 2021-12-11 NOTE — Therapy (Signed)
OUTPATIENT PHYSICAL THERAPY MALE PELVIC EVALUATION   Patient Name: Todd Jordan MRN: [id number] DOB:1996/03/09, 26 y.o., male Today's Date: 12/11/2021   PT End of Session - 12/11/21 1200     Visit Number 1    Date for PT Re-Evaluation 03/05/22    Authorization Type [employer]    PT Start Time 1103    PT Stop Time 1143    PT Time Calculation (min) 40 min    Activity Tolerance Patient tolerated treatment well    Behavior During Therapy [facility] for tasks assessed/performed             History reviewed. No pertinent past medical history. Past Surgical History:  Procedure Laterality Date   COLON RESECTION SIGMOID N/A 05/13/2020   Procedure: COLON RESECTION SIGMOID;  Surgeon: Diamantina Monks, MD;  Location: [facility] OR;  Service: General;  Laterality: N/A;   LAPAROTOMY N/A 05/13/2020   Procedure: EXPLORATORY LAPAROTOMY MOTION OF SPLENIC FLEXTION;  Surgeon: Diamantina Monks, MD;  Location: [facility] OR;  Service: General;  Laterality: N/A;   OSTOMY N/A 05/13/2020   Procedure: OSTOMY;  Surgeon: Diamantina Monks, MD;  Location: [facility];  Service: General;  Laterality: N/A;   Patient Active Problem List   Diagnosis Date Noted   Encounter for health examination of refugee 09/06/2020   Status post partial colectomy 08/14/2020   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  Driving a long time causes nauseous.  I have numbness  in hands.  I have diarrhea or constipation.  I have a bowel movement 4/day.   Fluid intake: 2-3 bottles of water   PAIN:  Are you having pain? Yes NPRS scale: 2/10 Pain location:  lower abdomen  Pain type: surgical inscision pain Pain description:       Aggravating factors: have to have abm  Relieving factors: have bowel movement   PRECAUTIONS: None  WEIGHT BEARING RESTRICTIONS No  FALLS:  Has patient fallen in last 6 months? 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 05/13/20 Sexual abuse: No  BOWEL MOVEMENT Pain with bowel movement: No Type of bowel movement:Type (Bristol Stool Scale) 1 or 7, Frequency 4/day, and Strain No Fully empty rectum: No Leakage: No Pads: No Fiber supplement:   URINATION Pain with urination: No        OBJECTIVE:   DIAGNOSTIC FINDINGS:    PATIENT SURVEYS:    PFIQ-7 = 38  COGNITION:  Overall cognitive status: Within functional limits for tasks assessed       MUSCLE LENGTH: Hamstrings: Right 70 deg; Left 70 deg  LUMBAR SPECIAL TESTS:  SLR - negative  FUNCTIONAL TESTS:  Single leg stand - unsteady bil with trendelenburg  GAIT:  Comments: WFL   POSTURE: rounded shoulders and decreased lumbar lordosis   PELVIC ALIGNMENT:  LUMBARAROM/PROM  A/PROM A/PROM  eval  Flexion WFL  Extension   Right lateral flexion   Left lateral flexion   Right rotation   Left rotation    (Blank rows = not tested)  LOWER EXTREMITY AROM/PROM:  A/PROM Right eval Left eval  Hip flexion  50% due to colostomy  Hip extension    Hip abduction    Hip adduction    Hip internal rotation     Hip external rotation    Knee flexion    Knee extension    Ankle dorsiflexion    Ankle plantarflexion    Ankle inversion    Ankle eversion     (Blank rows = not tested)  LOWER EXTREMITY MMT:  MMT Right eval Left eval  Hip flexion    Hip extension 4/5 4/5  Hip abduction 4/5 4/5  Hip adduction    Hip internal rotation    Hip external rotation    Knee flexion    Knee extension    Ankle dorsiflexion    Ankle plantarflexion    Ankle inversion    Ankle eversion     PALPATION: GENERAL tight gluteals and flexed coccyx              External Perineal Exam - palpation to perineum externally; needed cues to relax pelvic floor in order to get a contraction              Internal Pelvic Floor deferred Patient confirms identification and approves PT to assess internal pelvic floor and treatment Yes  PELVIC MMT:   MMT eval  Internal Anal Sphincter   External Anal Sphincter   Puborectalis   Diastasis Recti   (Blank rows = not tested)   TONE: High (external palpation)  TODAY'S TREATMENT    12/10/21 EVAL and intial HEP reviewed and educated how to do kegel and relax and bulge   PATIENT EDUCATION:  Education details: Access Code: [email or web address] Person educated: Patient Education method: Programmer, multimedia, Facilities manager, Actor cues, and Handouts Education comprehension: verbalized understanding and returned demonstration   HOME EXERCISE PROGRAM: Access Code: [email or web address] URL: [email or web address] Date: 12/11/2021 Prepared by: Dwana Curd  Exercises - Supine Hamstring Stretch with Strap  - 1 x daily - 7 x weekly - 1 sets - 3 reps - 30 sec hold - Supine Figure 4 Piriformis Stretch  - 1 x daily - 7 x weekly - 1 sets - 3 reps - 30 sec hold - Supine Piriformis Stretch with Foot on Ground  -  1 x daily - 7 x weekly - 1 sets - 3 reps - 30 sec hold - Supine Hip Internal and External Rotation  - 1 x daily - 7 x weekly - 1 sets - 10 reps - 5 sec hold - Sidelying  Pelvic Floor Contraction with Self-Palpation  - 1 x daily - 7 x weekly - 3 sets - 10 reps URL: [email or web address] Date: 12/11/2021 Prepared by: Dwana Curd  Exercises - Supine Hamstring Stretch with Strap  - 1 x daily - 7 x weekly - 1 sets - 3 reps - 30 sec hold - Supine Figure 4 Piriformis Stretch  - 1 x daily - 7 x weekly - 1 sets - 3 reps - 30 sec hold - Supine Piriformis Stretch with Foot on Ground  - 1 x daily - 7 x weekly - 1 sets - 3 reps - 30 sec hold - Supine Hip Internal and External Rotation  - 1 x daily - 7 x weekly - 1 sets - 10 reps - 5 sec hold - Sidelying Pelvic Floor Contraction with Self-Palpation  - 1 x daily - 7 x weekly - 3 sets - 10 reps  ASSESSMENT:  CLINICAL IMPRESSION: Patient is a 26 y.o. male who was seen today for physical therapy evaluation and treatment for constipation with dyssynergia. Pt has undergone colostomy placement due to sigmoidectomy.  Pt has tension in bil gluteals and hamstrings.  Pt has decreased ROM of the pelvic floor and stiff trunk.  Pt has bil weakness of the hip abductors.  Pt will benefit from skilled PT for improved muscle coordination and work towards successful colostomy reversal   OBJECTIVE IMPAIRMENTS decreased coordination, decreased endurance, decreased strength, impaired tone, and pain.   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   REHAB POTENTIAL: Good  CLINICAL DECISION MAKING: Evolving/moderate complexity  EVALUATION COMPLEXITY: Moderate   GOALS: Goals reviewed with patient? Yes  SHORT TERM GOALS: Target date: 01/08/2022   Ind with initial HEP Baseline: Goal status: INITIAL   LONG TERM GOALS: Target date: 03/05/2022    Pt will have at least 3/5 MMT pelvic floor and able to sustain for 10 sec for ability to get to bathroom Baseline:  Goal status: INITIAL  2.  Pt will be able to fully relax pelvic floor for proper defacation Baseline:  Goal status: INITIAL  3.  Pt will understand proper toileting techniques and breathing techniques and be able to perform in seated position for proper defacation Baseline:  Goal status: INITIAL  4.  Pt will be ind with advanced HEP for maintaining good bowel habits Baseline:  Goal status: INITIAL    PLAN: PT FREQUENCY: 1x/week  PT DURATION: 12 weeks  PLANNED INTERVENTIONS: Therapeutic exercises, Therapeutic activity, Neuromuscular re-education, Balance training, Gait training, Patient/Family education, Self Care, Joint mobilization, Dry Needling, Electrical stimulation, Cryotherapy, Moist heat, Taping, Biofeedback, Manual therapy, and Re-evaluation  PLAN FOR NEXT SESSION: go over toileting; contract/relax/bulge; palpation over shorts; review HEP and progress   [employer], PT 12/11/2021, 1:14 PM

## 2021-12-31 NOTE — Therapy (Unsigned)
OUTPATIENT PHYSICAL THERAPY MALE PELVIC EVALUATION   Patient Name: Todd Jordan MRN: [id number] DOB:1995-06-28, 26 y.o., male Today's Date: 01/01/2022   PT End of Session - 01/01/22 0933     Visit Number 2    Date for PT Re-Evaluation 03/05/22    Authorization Type [employer]    PT Start Time 0933    PT Stop Time 1015    PT Time Calculation (min) 42 min    Activity Tolerance Patient tolerated treatment well    Behavior During Therapy [facility] for tasks assessed/performed              History reviewed. No pertinent past medical history. Past Surgical History:  Procedure Laterality Date   COLON RESECTION SIGMOID N/A 05/13/2020   Procedure: COLON RESECTION SIGMOID;  Surgeon: Diamantina Monks, MD;  Location: [facility] OR;  Service: General;  Laterality: N/A;   LAPAROTOMY N/A 05/13/2020   Procedure: EXPLORATORY LAPAROTOMY MOTION OF SPLENIC FLEXTION;  Surgeon: Diamantina Monks, MD;  Location: [facility];  Service: General;  Laterality: N/A;   OSTOMY N/A 05/13/2020   Procedure: OSTOMY;  Surgeon: Diamantina Monks, MD;  Location: [facility];  Service: General;  Laterality: N/A;   Patient Active Problem List   Diagnosis Date Noted   Encounter for health examination of refugee 09/06/2020   Status post partial colectomy 08/14/2020   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   Status post surgery 05/13/2020   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  Driving a long time  causes nauseous.  I have numbness in hands.  I have diarrhea or constipation.  I have a bowel movement 4/day.   Fluid intake: 2-3 bottles of water   PAIN:  Are you having pain? Yes NPRS scale: 2/10 Pain location:  lower abdomen  Pain type: surgical inscision pain Pain description:       Aggravating factors: have to have abm  Relieving factors: have bowel movement   PRECAUTIONS: None  WEIGHT BEARING RESTRICTIONS No  FALLS:  Has patient fallen in last 6 months? No  LIVING ENVIRONMENT: Lives with: lives with their family and parents  and siblings Lives in: House/apartment   OCCUPATION: no  PLOF: Independent  PATIENT GOALS not feel sick and pain in abdomen; have more normal bowel movements  PERTINENT HISTORY:  history of exlap/sigmoidectomy/takedown of splenic flexure and descending colostomy for sigmoid volvulus with gangrenous sigmoid colon 05/13/20 Sexual abuse: No  BOWEL MOVEMENT Pain with bowel movement: No Type of bowel movement:Type (Bristol Stool Scale) 1 or 7, Frequency 4/day, and Strain No Fully empty rectum: No Leakage: No Pads: No Fiber supplement:   URINATION Pain with urination: No        OBJECTIVE:   DIAGNOSTIC FINDINGS:    PATIENT SURVEYS:    PFIQ-7 = 38  COGNITION:  Overall cognitive status: Within functional limits for tasks assessed       MUSCLE LENGTH: Hamstrings: Right 70 deg; Left 70 deg  LUMBAR SPECIAL TESTS:  SLR - negative  FUNCTIONAL TESTS:  Single leg stand - unsteady bil with trendelenburg  GAIT:  Comments: WFL   POSTURE: rounded shoulders and decreased lumbar lordosis   PELVIC ALIGNMENT:  LUMBARAROM/PROM  A/PROM A/PROM  eval  Flexion WFL  Extension   Right lateral flexion   Left lateral flexion   Right rotation   Left rotation    (Blank rows = not tested)  LOWER EXTREMITY AROM/PROM:  A/PROM Right eval Left eval  Hip flexion  50% due to colostomy  Hip extension    Hip abduction    Hip  adduction    Hip internal rotation    Hip external rotation    Knee flexion    Knee extension    Ankle dorsiflexion    Ankle plantarflexion    Ankle inversion    Ankle eversion     (Blank rows = not tested)  LOWER EXTREMITY MMT:  MMT Right eval Left eval  Hip flexion    Hip extension 4/5 4/5  Hip abduction 4/5 4/5  Hip adduction    Hip internal rotation    Hip external rotation    Knee flexion    Knee extension    Ankle dorsiflexion    Ankle plantarflexion    Ankle inversion    Ankle eversion     PALPATION: GENERAL tight gluteals and flexed coccyx              External Perineal Exam - palpation to perineum externally; needed cues to relax pelvic floor in order to get a contraction              Internal Pelvic Floor deferred Patient confirms identification and approves PT to assess internal pelvic floor and treatment Yes  PELVIC MMT:   MMT eval  Internal Anal Sphincter   External Anal Sphincter   Puborectalis   Diastasis Recti   (Blank rows = not tested)   TONE: High (external palpation)  TODAY'S TREATMENT    12/10/21 EVAL and intial HEP reviewed and educated how to do kegel and relax and bulge   PATIENT EDUCATION:  Education details: Access Code: [email or web address] Person educated: Patient Education method: Programmer, multimedia, Facilities manager, Actor cues, and Handouts Education comprehension: verbalized understanding and returned demonstration   HOME EXERCISE PROGRAM: Access Code: [email or web address]  ASSESSMENT:  CLINICAL IMPRESSION: Patient arrives for initial f/u visit.  He was able to perform exercises correctly with external palpation cues.  Pt was given education on how to have a bowel movement with positioning and breathing techniques. Pt will benefit from skilled PT for improved muscle coordination and work towards successful colostomy reversal   OBJECTIVE IMPAIRMENTS decreased  coordination, decreased endurance, decreased strength, impaired tone, and pain.    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   REHAB POTENTIAL: Good  CLINICAL DECISION MAKING: Evolving/moderate complexity  EVALUATION COMPLEXITY: Moderate   GOALS: Goals reviewed with patient? Yes  SHORT TERM GOALS: Target date: 01/08/2022   Ind with initial HEP Baseline: Goal status: INITIAL   LONG TERM GOALS: Target date: 03/05/2022   Pt will have at least 3/5 MMT pelvic floor and able to sustain for 10 sec for ability to get to bathroom Baseline:  Goal status: INITIAL  2.  Pt will be able to fully relax pelvic floor for proper defacation Baseline:  Goal status: INITIAL  3.  Pt will understand proper toileting techniques and breathing techniques and be able to perform in seated position for proper defacation Baseline:  Goal status: INITIAL  4.  Pt will be ind with advanced HEP for maintaining good bowel habits Baseline:  Goal status: INITIAL    PLAN: PT FREQUENCY: 1x/week  PT DURATION: 12 weeks  PLANNED INTERVENTIONS: Therapeutic exercises, Therapeutic activity, Neuromuscular re-education, Balance training, Gait training, Patient/Family education, Self Care, Joint mobilization, Dry Needling, Electrical stimulation, Cryotherapy, Moist heat, Taping, Biofeedback, Manual therapy, and Re-evaluation  PLAN FOR NEXT SESSION: internal assessment to see where his strength and coordination is at this time; review HEP and progress as appropriate   [employer], PT 01/01/2022, 10:22 AM

## 2022-01-01 ENCOUNTER — Ambulatory Visit: Payer: Commercial Managed Care - HMO | Attending: Surgery | Admitting: Physical Therapy

## 2022-01-01 ENCOUNTER — Encounter: Payer: Self-pay | Admitting: Physical Therapy

## 2022-01-01 DIAGNOSIS — R279 Unspecified lack of coordination: Secondary | ICD-10-CM | POA: Diagnosis present

## 2022-01-01 DIAGNOSIS — M6281 Muscle weakness (generalized): Secondary | ICD-10-CM | POA: Diagnosis present

## 2022-01-05 ENCOUNTER — Ambulatory Visit: Payer: Commercial Managed Care - HMO | Admitting: Physical Therapy

## 2022-01-05 DIAGNOSIS — M6281 Muscle weakness (generalized): Secondary | ICD-10-CM

## 2022-01-05 DIAGNOSIS — R279 Unspecified lack of coordination: Secondary | ICD-10-CM | POA: Diagnosis not present

## 2022-01-05 NOTE — Therapy (Addendum)
OUTPATIENT PHYSICAL THERAPY MALE PELVIC EVALUATION   Patient Name: Todd Jordan MRN: [id number] DOB:[birth year], 26 y.o., male Today's Date: 01/05/2022   PT End of Session - 01/05/22 0759     Visit Number 3    Date for PT Re-Evaluation 03/05/22    Authorization Type [employer]    PT Start Time 0759    PT Stop Time 0838    PT Time Calculation (min) 39 min    Activity Tolerance Patient tolerated treatment well    Behavior During Therapy [facility] for tasks assessed/performed               No past medical history on file. Past Surgical History:  Procedure Laterality Date   COLON RESECTION SIGMOID N/A 05/13/2020   Procedure: COLON RESECTION SIGMOID;  Surgeon: Jesusita Oka, MD;  Location: [place];  Service: General;  Laterality: N/A;   LAPAROTOMY N/A 05/13/2020   Procedure: EXPLORATORY LAPAROTOMY MOTION OF SPLENIC FLEXTION;  Surgeon: Jesusita Oka, MD;  Location: [place];  Service: General;  Laterality: N/A;   OSTOMY N/A 05/13/2020   Procedure: OSTOMY;  Surgeon: Jesusita Oka, MD;  Location: [place];  Service: General;  Laterality: N/A;   Patient Active Problem List   Diagnosis Date Noted   Encounter for health examination of refugee 09/06/2020   Status post partial colectomy 08/14/2020   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   Status post surgery 05/13/2020   Sigmoid volvulus ([place]) 05/13/2020    PCP: Erskine Emery, MD  REFERRING PROVIDER: Ileana Roup, MD  REFERRING DIAG: [phone] (ICD-10-CM) - Dyssynergic defecation  THERAPY DIAG:  Unspecified lack of coordination  Muscle weakness (generalized)  Rationale for Evaluation and Treatment Rehabilitation  ONSET DATE: 05/13/20  SUBJECTIVE:                                                                                                                                                                                           SUBJECTIVE STATEMENT: I am feeling okay.  The exercises are fine, I can feel the muscles working. I  feel dizziness Fluid intake: 2-3 bottles of water   PAIN:  Are you having pain? No   PRECAUTIONS: None  WEIGHT BEARING RESTRICTIONS No  FALLS:  Has patient fallen in last 6 months? No  LIVING ENVIRONMENT: Lives with: lives with their family and parents and siblings Lives in: House/apartment   OCCUPATION: no  PLOF: Independent  PATIENT GOALS not feel sick and pain in abdomen; have more normal bowel movements  PERTINENT HISTORY:  history of exlap/sigmoidectomy/takedown of splenic flexure and descending colostomy for sigmoid volvulus with gangrenous sigmoid colon 05/13/20 Sexual abuse: No  BOWEL MOVEMENT Pain with bowel movement: No Type of bowel movement:Type (Bristol Stool Scale) 1 or 7, Frequency 4/day, and Strain No Fully empty rectum: No Leakage: No Pads: No Fiber supplement:   URINATION Pain with urination: No        OBJECTIVE:   DIAGNOSTIC FINDINGS:    PATIENT SURVEYS:    PFIQ-7 = 38  COGNITION:  Overall cognitive status: Within functional limits for tasks assessed       MUSCLE LENGTH: Hamstrings: Right 70 deg; Left 70 deg  LUMBAR SPECIAL TESTS:  SLR - negative  FUNCTIONAL TESTS:  Single leg stand - unsteady bil with trendelenburg  GAIT:  Comments: WFL   POSTURE: rounded shoulders and decreased lumbar lordosis   PELVIC ALIGNMENT:  LUMBARAROM/PROM  A/PROM A/PROM  eval  Flexion WFL  Extension   Right lateral flexion   Left lateral flexion   Right rotation   Left rotation    (Blank rows = not tested)  LOWER EXTREMITY AROM/PROM:  A/PROM Right eval Left eval  Hip flexion  50% due to colostomy  Hip extension    Hip abduction    Hip adduction    Hip internal rotation    Hip external rotation    Knee flexion    Knee extension    Ankle dorsiflexion    Ankle plantarflexion    Ankle inversion    Ankle eversion     (Blank rows = not tested)  LOWER EXTREMITY MMT:  MMT Right eval Left eval  Hip flexion    Hip  extension 4/5 4/5  Hip abduction 4/5 4/5  Hip adduction    Hip internal rotation    Hip external rotation    Knee flexion    Knee extension    Ankle dorsiflexion    Ankle plantarflexion    Ankle inversion    Ankle eversion     PALPATION: GENERAL tight gluteals and flexed coccyx              External Perineal Exam - palpation to perineum externally; needed cues to relax pelvic floor in order to get a contraction              Internal Pelvic Floor deferred Patient confirms identification and approves PT to assess internal pelvic floor and treatment Yes  PELVIC MMT:   MMT 01/05/22   Internal Anal Sphincter 4/5  External Anal Sphincter 4/5  Puborectalis 4/5  Diastasis Recti   (Blank rows = not tested)   TONE: High (external palpation)  TODAY'S TREATMENT  01/05/22 Neuro - biofeedback with contract/relax, holding and bulging Hip flexor stretch Happy baby Child pose Breathing with stretches Seated with bulging  12/10/21 EVAL and intial HEP reviewed and educated how to do kegel and relax and bulge   PATIENT EDUCATION:  Education details: Access Code: [email or web address] Person educated: Patient Education method: Consulting civil engineer, Demonstration, Corporate treasurer cues, and Handouts Education comprehension: verbalized understanding and returned demonstration   HOME EXERCISE PROGRAM: Access Code: [email or web address]  ASSESSMENT:  CLINICAL IMPRESSION:  Pt is ind with HEP for pre-surgery strengthening and stretching.  He has 4/5 MMT which is sufficient for maintinaing control of bowel movements . He can hold for 20 sec.  As far as PT, he is expected to have good outcomes with continuing to do HEP on his own  OBJECTIVE IMPAIRMENTS decreased coordination, decreased endurance, decreased strength, impaired tone, and pain.   ACTIVITY LIMITATIONS toileting  PARTICIPATION LIMITATIONS: community activity and school  PERSONAL FACTORS 1-2 comorbidities: history of exlap/sigmoidectomy/takedown of  splenic flexure  and descending colostomy for sigmoid volvulus with gangrenous sigmoid colon 05/13/20  are also affecting patient's functional outcome.   REHAB POTENTIAL: Good  CLINICAL DECISION MAKING: Evolving/moderate complexity  EVALUATION COMPLEXITY: Moderate   GOALS: Goals reviewed with patient? Yes  SHORT TERM GOALS: Target date: 01/08/2022   Ind with initial HEP Baseline: Goal status: MET   LONG TERM GOALS: Target date: 03/05/2022   Pt will have at least 3/5 MMT pelvic floor and able to sustain for 10 sec for ability to get to bathroom Baseline: 4/5x 20 sec Goal status: MET  2.  Pt will be able to fully relax pelvic floor for proper defacation Baseline: can do this in sitting with stool Goal status: MET  3.  Pt will understand proper toileting techniques and breathing techniques and be able to perform in seated position for proper defacation Baseline:  Goal status: MET  4.  Pt will be ind with advanced HEP for maintaining good bowel habits Baseline:  Goal status: MET    PLAN: PT FREQUENCY: 1x/week  PT DURATION: 12 weeks  PLANNED INTERVENTIONS: Therapeutic exercises, Therapeutic activity, Neuromuscular re-education, Balance training, Gait training, Patient/Family education, Self Care, Joint mobilization, Dry Needling, Electrical stimulation, Cryotherapy, Moist heat, Taping, Biofeedback, Manual therapy, and Re-evaluation  PLAN FOR NEXT SESSION: d/c today   Jule Ser, PT 01/05/2022, 8:04 AM  PHYSICAL THERAPY DISCHARGE SUMMARY  Visits from Start of Care: 3  Current functional level related to goals / functional outcomes:   See above, met all goals  Remaining deficits: See above   Education / Equipment: HEP   Patient agrees to discharge. Patient goals were met. Patient is being discharged due to meeting the stated rehab goals.  Gustavus Bryant, PT 01/05/22 10:13 AM

## 2022-01-12 ENCOUNTER — Encounter: Payer: Commercial Managed Care - HMO | Admitting: Physical Therapy

## 2022-01-20 ENCOUNTER — Encounter: Payer: Commercial Managed Care - HMO | Admitting: Physical Therapy

## 2022-01-26 ENCOUNTER — Encounter: Payer: Commercial Managed Care - HMO | Admitting: Physical Therapy

## 2022-02-02 ENCOUNTER — Encounter: Payer: Commercial Managed Care - HMO | Admitting: Physical Therapy

## 2022-02-19 ENCOUNTER — Other Ambulatory Visit: Payer: Self-pay

## 2022-02-19 DIAGNOSIS — K5902 Outlet dysfunction constipation: Secondary | ICD-10-CM

## 2022-02-19 DIAGNOSIS — Z9049 Acquired absence of other specified parts of digestive tract: Secondary | ICD-10-CM

## 2022-04-27 ENCOUNTER — Encounter: Payer: Self-pay | Admitting: Physical Therapy

## 2022-04-27 ENCOUNTER — Ambulatory Visit: Payer: Commercial Managed Care - HMO | Attending: Surgery | Admitting: Physical Therapy

## 2022-04-27 ENCOUNTER — Other Ambulatory Visit: Payer: Self-pay

## 2022-04-27 DIAGNOSIS — M6281 Muscle weakness (generalized): Secondary | ICD-10-CM | POA: Diagnosis present

## 2022-04-27 DIAGNOSIS — R279 Unspecified lack of coordination: Secondary | ICD-10-CM | POA: Insufficient documentation

## 2022-04-27 NOTE — Therapy (Addendum)
OUTPATIENT PHYSICAL THERAPY MALE PELVIC EVALUATION   Patient Name: Todd Jordan MRN: [id number] DOB:Nov 25, 1995, 26 y.o., male Today's Date: 04/27/2022  END OF SESSION:  PT End of Session - 04/27/22 1237     Visit Number 1    Date for PT Re-Evaluation 07/20/22    Authorization Type [employer]    PT Start Time 1145    PT Stop Time 1223    PT Time Calculation (min) 38 min    Activity Tolerance Patient tolerated treatment well    Behavior During Therapy [facility] for tasks assessed/performed             History reviewed. No pertinent past medical history. Past Surgical History:  Procedure Laterality Date   COLON RESECTION SIGMOID N/A 05/13/2020   Procedure: COLON RESECTION SIGMOID;  Surgeon: Diamantina Monks, MD;  Location: [facility] OR;  Service: General;  Laterality: N/A;   LAPAROTOMY N/A 05/13/2020   Procedure: EXPLORATORY LAPAROTOMY MOTION OF SPLENIC FLEXTION;  Surgeon: Diamantina Monks, MD;  Location: [facility] OR;  Service: General;  Laterality: N/A;   OSTOMY N/A 05/13/2020   Procedure: OSTOMY;  Surgeon: Diamantina Monks, MD;  Location: [facility] OR;  Service: General;  Laterality: N/A;   Patient Active Problem List   Diagnosis Date Noted   Encounter for health examination of refugee 09/06/2020   Status post partial colectomy 08/14/2020   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   Status post surgery 05/13/2020   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    PCP: Alfredo Martinez, MD  REFERRING PROVIDER: Andria Meuse, MD   REFERRING DIAG:  [phone] (ICD-10-CM) - Outlet dysfunction constipation  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 Patient had the surgery and still has issues. Want to make sure the muscles are getting stronger for the surgery. Patient MD will decide in 2/24 to see if he will have surgery.     PAIN:  Are you having pain? No  PRECAUTIONS: None  WEIGHT BEARING RESTRICTIONS: No  FALLS:  Has patient fallen in last 6 months? No  LIVING ENVIRONMENT: Lives with: lives with their family  OCCUPATION: none  PLOF: Independent  PATIENT GOALS:  to strengthen pelvic floor muscles  PERTINENT HISTORY:  Ostomy 05/13/2020; colon resection sigmoid 05/13/20; Sigmoid volvulus  BOWEL MOVEMENT: Has an ostomy.    URINATION: NO issues with bladder  Pain with urination: No    OBJECTIVE:   DIAGNOSTIC FINDINGS:  None  COGNITION: Overall cognitive status: Within functional limits for tasks assessed     SENSATION: Light touch: Appears intact Proprioception: Appears intact    POSTURE: No Significant postural limitations  PELVIC ALIGNMENT:correct alignment   LOWER EXTREMITY ROM: Bilateral hip ROM is full   LOWER EXTREMITY MMT:  MMT Right eval Left eval  Hip flexion 4/5 4/5  Hip extension 4/5 4/5  Hip abduction 4/5 4/5  Hip adduction 4/5 4/5   PALPATION:   General  n/a                External Perineal Exam intact                             Internal Pelvic Floor Patient had some difficulty  pushing  the therapist finger out of the anal canal. He has some restrictions around the anus.   Patient confirms identification and approves PT to assess internal pelvic floor and treatment Yes  PELVIC MMT:   MMT eval  Internal Anal Sphincter 4/5  External Anal Sphincter 4/5  Puborectalis 4/5  (Blank rows = not tested)        TONE: good   TODAY'S TREATMENT:                                                                                                                               DATE:   EVAL finished eva;   PATIENT EDUCATION:  Education details: none to day   HOME EXERCISE PROGRAM: none  ASSESSMENT:  CLINICAL IMPRESSION: Patient is a 26 y.o. male who was seen today for physical therapy evaluation and treatment for outlet dysfunction constipation. Patient has had an ostomy on 05/13/2020. MD will determine if he is ready to have a reversal in 06/2022. Pelvic floor muscle strength is 4/5. He was able to relax the pelvic floor but some difficulty with pushing the therapist finger fully out. He has some restrictions around the anus due to tightness. Patient has not had to use the rectal muscles since 05/13/2020. Patient will benefit from skilled therapy to increase strength of thea anal muscles and improve the coordination to be ready for normal bowel function after his reversal.   OBJECTIVE IMPAIRMENTS: decreased activity tolerance, decreased coordination, decreased endurance, decreased strength, and increased fascial restrictions.   ACTIVITY LIMITATIONS: toileting  PARTICIPATION LIMITATIONS:  out in the community  PERSONAL FACTORS: 1-2 comorbidities: Ostomy 05/13/2020; colon resection sigmoid 05/13/20; Sigmoid volvulus  are also affecting patient's functional outcome.   REHAB POTENTIAL: Excellent  CLINICAL DECISION MAKING: Stable/uncomplicated  EVALUATION COMPLEXITY: Low   GOALS: Goals reviewed with patient? Yes  SHORT TERM GOALS: Target date:  05/25/2022  Independent with initial HEP for pelvic floor and core strength.  Baseline: Goal status: INITIAL    LONG TERM GOALS: Target date: 07/20/2022  Pt will have at least 4/5 MMT pelvic floor and able to sustain for 40 sec for ability to get to bathroom  Baseline:  Goal status: INITIAL  2.  Pt will be able to fully relax pelvic floor for proper defacation to push the therapist finger out of the rectum to facilitate a bowel movement Baseline:  Goal status: INITIAL  3.  Pt will  understand proper toileting techniques and breathing techniques and be able to perform in seated position for proper defacatio  Baseline:  Goal status: INITIAL  4.  Pt will be ind with advanced HEP for maintaining good bowel habits  Baseline:  Goal status: INITIAL  5.  Patient understands how to exercise in the gym while not straining the pelvic floor to build his endurance of the pelvic floor muscles.  Baseline:  Goal status: INITIAL   PLAN:  PT FREQUENCY: 1x/week  PT DURATION: 12 weeks  PLANNED INTERVENTIONS: Therapeutic exercises, Therapeutic activity, Neuromuscular re-education, Patient/Family education, Self Care, Biofeedback, and Manual therapy  PLAN FOR NEXT SESSION: pelvic floor EMG working on contraction and relaxation of the anus, elliptical, leg press, gym exercises   Eulis Foster, PT 04/27/22 2:02 PM

## 2022-05-06 ENCOUNTER — Ambulatory Visit: Payer: Commercial Managed Care - HMO | Admitting: Physical Therapy

## 2022-05-06 ENCOUNTER — Telehealth: Payer: Self-pay | Admitting: Physical Therapy

## 2022-05-06 NOTE — Telephone Encounter (Signed)
Called and person on the phone said he went to appt.  Eulis Foster, PT @12 /20/23@ 12:56 PM

## 2022-05-27 ENCOUNTER — Ambulatory Visit: Payer: Medicaid Other | Attending: Surgery | Admitting: Physical Therapy

## 2022-05-27 ENCOUNTER — Encounter: Payer: Self-pay | Admitting: Physical Therapy

## 2022-05-27 DIAGNOSIS — M6281 Muscle weakness (generalized): Secondary | ICD-10-CM | POA: Diagnosis present

## 2022-05-27 DIAGNOSIS — R279 Unspecified lack of coordination: Secondary | ICD-10-CM | POA: Diagnosis not present

## 2022-05-27 NOTE — Therapy (Signed)
OUTPATIENT PHYSICAL THERAPY TREATMENT NOTE   Patient Name: Todd Jordan MRN: [id number] DOB:Jul 05, 1995, 27 y.o., male Today's Date: 05/27/2022  PCP: Alfredo Martinez, MD  REFERRING PROVIDER: Andria Meuse, MD    END OF SESSION:   PT End of Session - 05/27/22 0845     Visit Number 2    Date for PT Re-Evaluation 07/20/22    Authorization Type [employer]    Authorization Time Period 04/27/2022-06/26/2022    Authorization - Visit Number 1    Authorization - Number of Visits 10    PT Start Time 0845    PT Stop Time 0925    PT Time Calculation (min) 40 min    Activity Tolerance Patient tolerated treatment well    Behavior During Therapy [facility] for tasks assessed/performed             History reviewed. No pertinent past medical history. Past Surgical History:  Procedure Laterality Date   COLON RESECTION SIGMOID N/A 05/13/2020   Procedure: COLON RESECTION SIGMOID;  Surgeon: Diamantina Monks, MD;  Location: [facility] OR;  Service: General;  Laterality: N/A;   LAPAROTOMY N/A 05/13/2020   Procedure: EXPLORATORY LAPAROTOMY MOTION OF SPLENIC FLEXTION;  Surgeon: Diamantina Monks, MD;  Location: [facility];  Service: General;  Laterality: N/A;   OSTOMY N/A 05/13/2020   Procedure: OSTOMY;  Surgeon: Diamantina Monks, MD;  Location: [facility];  Service: General;  Laterality: N/A;   Patient Active Problem List   Diagnosis Date Noted   Encounter for health examination of refugee 09/06/2020   Status post partial colectomy 08/14/2020   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   Status post surgery 05/13/2020   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   REFERRING DIAG:  K59.02 (ICD-10-CM) - Outlet dysfunction constipation  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 I am doing good. I am doing the exercise. I am going to the bathroom easier.   Interpreter is present with treatment and his father.    PAIN:  Are you having pain? No   PRECAUTIONS: None   WEIGHT BEARING RESTRICTIONS: No  FALLS:  Has patient fallen in last 6 months? No   LIVING ENVIRONMENT: Lives with: lives with their family   OCCUPATION: none   PLOF: Independent   PATIENT GOALS: to strengthen pelvic floor muscles   PERTINENT HISTORY:  Ostomy 05/13/2020; colon resection sigmoid 05/13/20; Sigmoid volvulus   BOWEL MOVEMENT: Has an ostomy.      URINATION: NO issues with bladder  Pain with urination: No       OBJECTIVE:    DIAGNOSTIC FINDINGS:  None   COGNITION: Overall cognitive status: Within functional limits for tasks assessed                          SENSATION: Light touch: Appears intact Proprioception: Appears intact       POSTURE: No Significant postural limitations   PELVIC ALIGNMENT:correct alignment     LOWER EXTREMITY ROM: Bilateral hip ROM is full     LOWER EXTREMITY MMT:   MMT Right eval Left eval  Hip flexion 4/5 4/5  Hip extension 4/5 4/5  Hip abduction 4/5 4/5  Hip adduction 4/5 4/5    PALPATION:   General  n/a                 External Perineal Exam intact                             Internal Pelvic Floor Patient had some difficulty  pushing  the therapist finger out of the anal canal. He has some restrictions around the anus.    Patient confirms identification and approves PT to assess internal pelvic floor and treatment Yes   PELVIC MMT:   MMT eval  Internal Anal Sphincter 4/5  External Anal Sphincter 4/5  Puborectalis 4/5  (Blank rows = not tested)          TONE: good     TODAY'S TREATMENT:  05/26/22 Neuromuscular re-education: Pelvic floor contraction training: Pelvic floor EMG with surface electrodes on the anus Diaphragmatic breathing to reduce the pelvic floor  below10 uv Exercises: Stretches/mobility: Strengthening: Elliptical going over technique Leg press 110# both legs 2x10 Hip extension machine 55# 3x10      PATIENT EDUCATION: 05/26/2022 Education details: Access Code: [email or web address] Person educated: Patient Education method: Consulting civil engineer, Demonstration, Corporate treasurer cues, Verbal cues, and Handouts Education comprehension: verbalized understanding, returned demonstration, verbal cues required, tactile cues required, and needs further education     HOME EXERCISE PROGRAM: 05/26/22 Access Code: [facility] URL: [email or web address] Date: 05/27/2022 Prepared by: Earlie Counts  Exercises - Elliptical  - 1 x daily - 3 x weekly - 1 sets - 1 reps - 5 minutes hold - Full Leg Press  - 1 x daily - 3 x weekly - 3 sets - 10 reps - Standing Cable Hip Extension  - 1 x daily - 3 x weekly - 3 sets - 10 reps - Seated Diaphragmatic Breathing  - 3 x daily - 7 x weekly - 3 sets - 10 reps   ASSESSMENT:   CLINICAL IMPRESSION: Patient is a 27 y.o. male who was seen today for physical therapy  treatment for outlet dysfunction constipation. Patient is learning how to exercise at the gym to increase his strength. He reports he is able to go to the bathroom with greater ease. Patient has no pain with the exercises. Patient resting level was at 10 uv in sitting and able to reduce to 8 uv with diaphragmatic  breathing. Patient will benefit from skilled therapy to increase strength of thea anal muscles and improve the coordination to be ready for normal bowel function after his reversal.    OBJECTIVE IMPAIRMENTS: decreased activity tolerance, decreased coordination, decreased endurance, decreased strength, and increased fascial restrictions.    ACTIVITY  LIMITATIONS: toileting   PARTICIPATION LIMITATIONS:  out in the community   PERSONAL FACTORS: 1-2 comorbidities: Ostomy 05/13/2020; colon resection sigmoid 05/13/20; Sigmoid volvulus  are also affecting patient's functional outcome.    REHAB POTENTIAL: Excellent   CLINICAL DECISION MAKING: Stable/uncomplicated   EVALUATION COMPLEXITY: Low     GOALS: Goals reviewed with patient? Yes   SHORT TERM GOALS: Target date: 05/25/2022   Independent with initial HEP for pelvic floor and core strength.  Baseline: Goal status: INITIAL       LONG TERM GOALS: Target date: 07/20/2022   Pt will have at least 4/5 MMT pelvic floor and able to sustain for 40 sec for ability to get to bathroom  Baseline:  Goal status: INITIAL   2.  Pt will be able to fully relax pelvic floor for proper defacation to push the therapist finger out of the rectum to facilitate a bowel movement Baseline:  Goal status: INITIAL   3.  Pt will understand proper toileting techniques and breathing techniques and be able to perform in seated position for proper defacatio  Baseline:  Goal status: INITIAL   4.  Pt will be ind with advanced HEP for maintaining good bowel habits  Baseline:  Goal status: INITIAL   5.  Patient understands how to exercise in the gym while not straining the pelvic floor to build his endurance of the pelvic floor muscles.  Baseline:  Goal status: INITIAL     PLAN:   PT FREQUENCY: 1x/week   PT DURATION: 12 weeks   PLANNED INTERVENTIONS: Therapeutic exercises, Therapeutic activity, Neuromuscular re-education, Patient/Family education, Self Care, Biofeedback, and Manual therapy   PLAN FOR NEXT SESSION: pelvic floor EMG working on contraction and relaxation of the anus, abdominal,  hip abduction and adduction, bike,   Earlie Counts, PT 05/27/22 9:31 AM

## 2022-06-03 ENCOUNTER — Ambulatory Visit: Payer: Medicaid Other | Admitting: Physical Therapy

## 2022-06-03 ENCOUNTER — Encounter: Payer: Self-pay | Admitting: Physical Therapy

## 2022-06-03 DIAGNOSIS — M6281 Muscle weakness (generalized): Secondary | ICD-10-CM

## 2022-06-03 DIAGNOSIS — R279 Unspecified lack of coordination: Secondary | ICD-10-CM | POA: Diagnosis not present

## 2022-06-03 NOTE — Therapy (Signed)
OUTPATIENT PHYSICAL THERAPY TREATMENT NOTE   Patient Name: Todd Jordan MRN: [id number] DOB:1995-08-10, 27 y.o., male Today's Date: 06/03/2022  PCP: Alfredo Martinez, MD  REFERRING PROVIDER: Andria Meuse, MD     END OF SESSION:   PT End of Session - 06/03/22 1529     Visit Number 3    Date for PT Re-Evaluation 07/20/22    Authorization Type [employer]    Authorization Time Period 04/27/2022-06/26/2022    Authorization - Visit Number 2    Authorization - Number of Visits 10    PT Start Time 1530    PT Stop Time 1610    PT Time Calculation (min) 40 min    Activity Tolerance Patient tolerated treatment well    Behavior During Therapy [facility] for tasks assessed/performed             History reviewed. No pertinent past medical history. Past Surgical History:  Procedure Laterality Date   COLON RESECTION SIGMOID N/A 05/13/2020   Procedure: COLON RESECTION SIGMOID;  Surgeon: Diamantina Monks, MD;  Location: [facility] OR;  Service: General;  Laterality: N/A;   LAPAROTOMY N/A 05/13/2020   Procedure: EXPLORATORY LAPAROTOMY MOTION OF SPLENIC FLEXTION;  Surgeon: Diamantina Monks, MD;  Location: [facility] OR;  Service: General;  Laterality: N/A;   OSTOMY N/A 05/13/2020   Procedure: OSTOMY;  Surgeon: Diamantina Monks, MD;  Location: [facility] OR;  Service: General;  Laterality: N/A;   Patient Active Problem List   Diagnosis Date Noted   Encounter for health examination of refugee 09/06/2020   Status post partial colectomy 08/14/2020   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   Status post surgery 05/13/2020   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   REFERRING DIAG:  K59.02 (ICD-10-CM) - Outlet dysfunction constipation  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 I plan to go to the gym in the future.   Interpreter is present with treatment and his father.    PAIN:  Are you having pain? No   PRECAUTIONS: None   WEIGHT BEARING RESTRICTIONS: No   FALLS:  Has patient fallen  in last 6 months? No   LIVING ENVIRONMENT: Lives with: lives with their family   OCCUPATION: none   PLOF: Independent   PATIENT GOALS: to strengthen pelvic floor muscles   PERTINENT HISTORY:  Ostomy 05/13/2020; colon resection sigmoid 05/13/20; Sigmoid volvulus   BOWEL MOVEMENT: Has an ostomy.      URINATION: NO issues with bladder  Pain with urination: No       OBJECTIVE:    DIAGNOSTIC FINDINGS:  None   COGNITION: Overall cognitive status: Within functional limits for tasks assessed                          SENSATION: Light touch: Appears intact Proprioception: Appears intact       POSTURE: No Significant postural limitations   PELVIC ALIGNMENT:correct alignment     LOWER EXTREMITY ROM: Bilateral hip ROM is full     LOWER EXTREMITY MMT:   MMT Right eval Left eval  Hip flexion 4/5 4/5  Hip extension 4/5 4/5  Hip abduction 4/5 4/5  Hip adduction 4/5 4/5    PALPATION:   General  n/a                 External Perineal Exam intact                             Internal Pelvic Floor Patient had some difficulty  pushing  the therapist finger out of the anal canal. He has some restrictions around the anus.    Patient confirms identification and approves PT to assess internal pelvic floor and treatment Yes   PELVIC MMT:   MMT eval  Internal Anal Sphincter 4/5  External Anal Sphincter 4/5  Puborectalis 4/5  (Blank rows = not tested)         TONE: good     TODAY'S TREATMENT:   06/03/22 Neuromuscular re-education: Pelvic floor contraction training: 1/2 kneel with diagonals using green band 15x each way Lunge forward pulling on green band 20x  Squat with scapula retraction with green band 20x  Squat with rotation to work on core with pulling on green band Side step holding onto green band 20x each way Down training: Exercises: Stretches/mobility: Strengthening: Recumbent bike for 8 minutes at level 1 while assess patient with interpreter Educated patient to walk 30 minutes per day to improve strength and endurance Leg press 110# 3/10  Hip extension machine 70# 3x10 Hip adduction machine 70# 3x 10    05/26/22 Neuromuscular re-education: Pelvic floor contraction training: Pelvic floor EMG with surface electrodes on the anus Diaphragmatic breathing to reduce the pelvic floor  below10 uv Exercises: Stretches/mobility: Strengthening: Elliptical going over technique Leg press 110# both legs 2x10 Hip extension machine 55# 3x10      PATIENT EDUCATION: 06/03/2022 Education details: Access Code: [email or web address] Person educated: Patient Education method: Consulting civil engineer, Demonstration, Corporate treasurer cues, Verbal cues, and Handouts Education comprehension: verbalized understanding, returned demonstration, verbal cues required, tactile cues required, and needs further education     HOME EXERCISE PROGRAM: 06/03/22 Access Code: [facility] URL: [email or web address] Date: 06/03/2022 Prepared by: Earlie Counts  Exercises - Elliptical  - 1 x daily - 3 x weekly - 1 sets - 1 reps - 5 minutes hold - Full Leg Press  - 1 x daily - 3 x weekly - 3 sets - 10 reps - Standing Cable Hip Extension  - 1 x daily - 3 x weekly - 3  sets - 10 reps - Seated Diaphragmatic Breathing  - 3 x daily - 7 x weekly - 3 sets - 10 reps - Scapular Retraction with Resistance  - 1 x daily - 3 x weekly - 3 sets - 10 reps - Squatting Anti-Rotation Press  - 1 x daily - 3 x weekly - 3 sets - 10 reps -  Lunge and Press with Resistance  - 1 x daily - 3 x weekly - 2 sets - 10 reps - Half Kneeling Diagonal Chops with Resistance  - 1 x daily - 3 x weekly - 2 sets - 10 reps   ASSESSMENT:   CLINICAL IMPRESSION: Patient is a 27 y.o. male who was seen today for physical therapy  treatment for outlet dysfunction constipation.  Patient is not ready to go to a gym yet due to having the colostomy bag. He has learned exercises to do at home and was able to feel the muscles work. Patient will benefit from skilled therapy to increase strength of thea anal muscles and improve the coordination to be ready for normal bowel function after his reversal.    OBJECTIVE IMPAIRMENTS: decreased activity tolerance, decreased coordination, decreased endurance, decreased strength, and increased fascial restrictions.    ACTIVITY LIMITATIONS: toileting   PARTICIPATION LIMITATIONS:  out in the community   PERSONAL FACTORS: 1-2 comorbidities: Ostomy 05/13/2020; colon resection sigmoid 05/13/20; Sigmoid volvulus  are also affecting patient's functional outcome.    REHAB POTENTIAL: Excellent   CLINICAL DECISION MAKING: Stable/uncomplicated   EVALUATION COMPLEXITY: Low     GOALS: Goals reviewed with patient? Yes   SHORT TERM GOALS: Target date: 05/25/2022   Independent with initial HEP for pelvic floor and core strength.  Baseline: Goal status: Met 06/03/22       LONG TERM GOALS: Target date: 07/20/2022   Pt will have at least 4/5 MMT pelvic floor and able to sustain for 40 sec for ability to get to bathroom  Baseline:  Goal status: INITIAL   2.  Pt will be able to fully relax pelvic floor for proper defacation to push the therapist finger out of the rectum to facilitate a bowel movement Baseline:  Goal status: INITIAL   3.  Pt will understand proper toileting techniques and breathing techniques and be able to perform in seated position for proper defacatio  Baseline:  Goal status: INITIAL   4.  Pt will be  ind with advanced HEP for maintaining good bowel habits  Baseline:  Goal status: INITIAL   5.  Patient understands how to exercise in the gym while not straining the pelvic floor to build his endurance of the pelvic floor muscles.  Baseline:  Goal status: INITIAL     PLAN:   PT FREQUENCY: 1x/week   PT DURATION: 12 weeks   PLANNED INTERVENTIONS: Therapeutic exercises, Therapeutic activity, Neuromuscular re-education, Patient/Family education, Self Care, Biofeedback, and Manual therapy   PLAN FOR NEXT SESSION: pelvic floor EMG working on contraction and relaxation of the anus, go over toileting,  bike,    Earlie Counts, PT 06/03/22 4:15 PM

## 2022-06-08 ENCOUNTER — Ambulatory Visit: Payer: Medicaid Other | Admitting: Physical Therapy

## 2022-06-08 ENCOUNTER — Encounter: Payer: Self-pay | Admitting: Physical Therapy

## 2022-06-08 DIAGNOSIS — R279 Unspecified lack of coordination: Secondary | ICD-10-CM

## 2022-06-08 DIAGNOSIS — M6281 Muscle weakness (generalized): Secondary | ICD-10-CM

## 2022-06-08 NOTE — Therapy (Signed)
OUTPATIENT PHYSICAL THERAPY TREATMENT NOTE   Patient Name: Todd Jordan MRN: [id number] DOB:Nov 14, 1995, 27 y.o., male Today's Date: 06/08/2022  PCP: Erskine Emery, MD  REFERRING PROVIDER: Ileana Roup, MD   END OF SESSION:   PT End of Session - 06/08/22 1231     Visit Number 4    Date for PT Re-Evaluation 07/20/22    Authorization Type [employer]    Authorization Time Period 04/27/2022-06/26/2022    Authorization - Visit Number 3    Authorization - Number of Visits 10    PT Start Time 1230    PT Stop Time 1310    PT Time Calculation (min) 40 min    Activity Tolerance Patient tolerated treatment well    Behavior During Therapy [facility] for tasks assessed/performed             History reviewed. No pertinent past medical history. Past Surgical History:  Procedure Laterality Date   COLON RESECTION SIGMOID N/A 05/13/2020   Procedure: COLON RESECTION SIGMOID;  Surgeon: Jesusita Oka, MD;  Location: [place];  Service: General;  Laterality: N/A;   LAPAROTOMY N/A 05/13/2020   Procedure: EXPLORATORY LAPAROTOMY MOTION OF SPLENIC FLEXTION;  Surgeon: Jesusita Oka, MD;  Location: [place];  Service: General;  Laterality: N/A;   OSTOMY N/A 05/13/2020   Procedure: OSTOMY;  Surgeon: Jesusita Oka, MD;  Location: [place];  Service: General;  Laterality: N/A;   Patient Active Problem List   Diagnosis Date Noted   Encounter for health examination of refugee 09/06/2020   Status post partial colectomy 08/14/2020   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   Status post surgery 05/13/2020   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    Interpreter is present with treatment and his father.    PAIN:  Are you having pain? No   PRECAUTIONS: None   WEIGHT BEARING RESTRICTIONS: No   FALLS:  Has patient fallen in last 6 months? No   LIVING ENVIRONMENT: Lives with: lives with their family  OCCUPATION: none   PLOF: Independent   PATIENT GOALS: to strengthen pelvic floor muscles   PERTINENT HISTORY:  Ostomy 05/13/2020; colon resection sigmoid 05/13/20; Sigmoid volvulus   BOWEL MOVEMENT: Has an ostomy.      URINATION: NO issues with bladder  Pain with urination: No       OBJECTIVE:    DIAGNOSTIC FINDINGS:  None   COGNITION: Overall cognitive status: Within functional limits for tasks assessed                          SENSATION: Light touch: Appears intact Proprioception: Appears intact       POSTURE: No Significant postural limitations   PELVIC ALIGNMENT:correct alignment     LOWER EXTREMITY ROM: Bilateral hip ROM is full     LOWER EXTREMITY MMT:   MMT Right eval Left eval  Hip flexion 4/5 4/5  Hip extension 4/5 4/5  Hip abduction 4/5 4/5  Hip adduction 4/5 4/5    PALPATION:   General  n/a                 External Perineal Exam intact                             Internal Pelvic Floor Patient had some difficulty  pushing  the therapist finger out of the anal canal. He has some restrictions around the anus.    Patient confirms identification and approves PT to assess internal pelvic floor and treatment Yes   PELVIC MMT:   MMT eval  Internal Anal Sphincter 4/5  External Anal Sphincter 4/5  Puborectalis 4/5  (Blank rows = not tested)         TONE: good     TODAY'S TREATMENT:  06/08/2022  Exercises: Strengthening: Recumbent bike level 3 for 6 minutes  while assessing patient Hip extension machine 100# 3x10 Hip adduction machine 70# 3x 10 Hip abuction 70# 3x10 Contract the anus for 40 seconds 5 times Therapeutic activities: Functional strengthening activities: Educated patient on correct breathing into the abdomen to relax the pelvic floor for a bowel movement  Breathing out with force for 10 seconds to push stool out.     06/03/22 Neuromuscular re-education: Pelvic floor contraction training: 1/2 kneel with diagonals using green band 15x each way Lunge forward pulling on green band 20x  Squat with scapula retraction with green band 20x  Squat with rotation to work on core with pulling on green band Side step holding onto green band 20x each way Exercises: Strengthening: Recumbent bike for 8 minutes at level 1 while assess patient with interpreter Educated patient to walk 30 minutes per day to improve strength and endurance Leg press 110# 3/10  Hip extension machine 70# 3x10 Hip adduction machine 70# 3x 10     05/26/22 Neuromuscular re-education: Pelvic floor contraction training: Pelvic floor EMG with surface electrodes on the anus Diaphragmatic breathing to reduce the pelvic floor  below10 uv Exercises: Stretches/mobility: Strengthening: Elliptical going over technique Leg press 110# both legs 2x10 Hip extension machine 55# 3x10      PATIENT EDUCATION: 06/03/2022 Education details: Access Code: [email or web address] Person educated: Patient Education method: Programmer, multimedia, Demonstration, Actor cues, Verbal cues, and Handouts Education comprehension: verbalized understanding, returned demonstration, verbal cues required, tactile cues required, and needs further education     HOME EXERCISE PROGRAM: 06/03/22 Access Code: [facility] URL: [email or web address] Date: 06/03/2022 Prepared by: Eulis Foster  Exercises - Elliptical  - 1 x daily - 3 x weekly - 1 sets - 1 reps - 5 minutes hold - Full Leg Press  - 1 x daily -  3 x weekly - 3 sets - 10 reps - Standing Cable Hip Extension  - 1 x daily - 3 x weekly - 3 sets - 10 reps - Seated Diaphragmatic Breathing  - 3 x daily - 7 x weekly - 3 sets - 10 reps - Scapular Retraction with Resistance  - 1 x daily - 3 x weekly - 3 sets - 10 reps - Squatting Anti-Rotation Press  - 1 x daily - 3 x weekly - 3 sets - 10 reps - Lunge and Press with Resistance  - 1 x daily - 3 x weekly - 2 sets - 10 reps - Half Kneeling Diagonal Chops with Resistance  - 1 x daily - 3 x weekly - 2 sets - 10 reps   ASSESSMENT:   CLINICAL IMPRESSION: Patient is a 27 y.o. male who was seen today for physical therapy  treatment for outlet dysfunction constipation.  Patient was able to demonstrate correct diaphragmatic breathing. He is able to understands when he sits on a commode he is suppose to have knees above hips. He is doing his exercises daily and feels stronger. Patient will benefit from skilled therapy to increase strength of the anal muscles and improve the coordination to be ready for normal bowel function after his reversal.    OBJECTIVE IMPAIRMENTS: decreased activity tolerance, decreased coordination, decreased endurance, decreased strength, and increased fascial restrictions.    ACTIVITY LIMITATIONS: toileting   PARTICIPATION LIMITATIONS:  out in the community   PERSONAL FACTORS: 1-2 comorbidities: Ostomy 05/13/2020; colon resection sigmoid 05/13/20; Sigmoid volvulus  are also affecting patient's functional outcome.    REHAB POTENTIAL: Excellent   CLINICAL DECISION MAKING: Stable/uncomplicated   EVALUATION COMPLEXITY: Low     GOALS: Goals reviewed with patient? Yes   SHORT TERM GOALS: Target date: 05/25/2022   Independent with initial HEP for pelvic floor and core strength.  Baseline: Goal status: Met 06/03/22       LONG TERM GOALS: Target date: 07/20/2022   Pt will have at least 4/5 MMT pelvic floor and able to sustain for 40 sec for ability to get to bathroom  Baseline:   Goal status: INITIAL   2.  Pt will be able to fully relax pelvic floor for proper defacation to push the therapist finger out of the rectum to facilitate a bowel movement Baseline:  Goal status: INITIAL   3.  Pt will understand proper toileting techniques and breathing techniques and be able to perform in seated position for proper defacatio  Baseline:  Goal status: Met 06/08/22   4.  Pt will be ind with advanced HEP for maintaining good bowel habits  Baseline:  Goal status: INITIAL   5.  Patient understands how to exercise in the gym while not straining the pelvic floor to build his endurance of the pelvic floor muscles.  Baseline:  Goal status: Met 06/08/22     PLAN:   PT FREQUENCY: 1x/week   PT DURATION: 12 weeks   PLANNED INTERVENTIONS: Therapeutic exercises, Therapeutic activity, Neuromuscular re-education, Patient/Family education, Self Care, Biofeedback, and Manual therapy   PLAN FOR NEXT SESSION: pelvic floor EMG working on contraction and relaxation of the anus, go over toileting,  bike,   Earlie Counts, PT 06/08/22 1:10 PM

## 2022-06-15 ENCOUNTER — Encounter: Payer: Self-pay | Admitting: Physical Therapy

## 2022-06-15 ENCOUNTER — Ambulatory Visit: Payer: Medicaid Other | Admitting: Physical Therapy

## 2022-06-15 DIAGNOSIS — M6281 Muscle weakness (generalized): Secondary | ICD-10-CM

## 2022-06-15 DIAGNOSIS — R279 Unspecified lack of coordination: Secondary | ICD-10-CM | POA: Diagnosis not present

## 2022-06-15 NOTE — Therapy (Signed)
OUTPATIENT PHYSICAL THERAPY TREATMENT NOTE   Patient Name: Todd Jordan MRN: [id number] DOB:03/21/1996, 27 y.o., male Today's Date: 06/15/2022  PCP: Erskine Emery, MD  REFERRING PROVIDER: Ileana Roup, MD   END OF SESSION:   PT End of Session - 06/15/22 1101     Visit Number 5    Date for PT Re-Evaluation 07/20/22    Authorization Type [employer]    Authorization Time Period 04/27/2022-06/26/2022    Authorization - Visit Number 4    Authorization - Number of Visits 10    PT Start Time 1100    PT Stop Time 1140    PT Time Calculation (min) 40 min    Activity Tolerance Patient tolerated treatment well    Behavior During Therapy [facility] for tasks assessed/performed             History reviewed. No pertinent past medical history. Past Surgical History:  Procedure Laterality Date   COLON RESECTION SIGMOID N/A 05/13/2020   Procedure: COLON RESECTION SIGMOID;  Surgeon: Jesusita Oka, MD;  Location: 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   REFERRING DIAG:  K59.02 (ICD-10-CM) - Outlet dysfunction constipation  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  Interpreter is present with treatment and his father.    PAIN:  Are you having pain? No   PRECAUTIONS: None   WEIGHT BEARING RESTRICTIONS: No   FALLS:  Has patient fallen in last 6 months? No   LIVING ENVIRONMENT: Lives with: lives with their family  OCCUPATION: none   PLOF: Independent   PATIENT GOALS: to strengthen pelvic floor muscles   PERTINENT HISTORY:  Ostomy 05/13/2020; colon resection sigmoid 05/13/20; Sigmoid volvulus   BOWEL MOVEMENT: Has an ostomy.      URINATION: NO issues with bladder  Pain with urination: No       OBJECTIVE:    DIAGNOSTIC FINDINGS:  None   COGNITION: Overall cognitive status: Within functional limits for tasks assessed                          SENSATION: Light touch: Appears intact Proprioception: Appears intact       POSTURE: No Significant postural limitations   PELVIC ALIGNMENT:correct alignment     LOWER EXTREMITY ROM: Bilateral hip ROM is full     LOWER EXTREMITY MMT:   MMT Right eval Left eval  Hip flexion 4/5 4/5  Hip extension 4/5 4/5  Hip abduction 4/5 4/5  Hip adduction 4/5 4/5    PALPATION:   General  n/a                 External Perineal Exam intact                             Internal Pelvic Floor Patient had some difficulty  pushing  the therapist finger out of the anal canal. He has some restrictions around the anus.    Patient confirms identification and approves PT to assess internal pelvic floor and treatment Yes   PELVIC MMT:   MMT eval  Internal Anal Sphincter 4/5  External Anal Sphincter 4/5  Puborectalis 4/5  (Blank rows = not tested)         TONE: good     TODAY'S TREATMENT:  06/15/22 Neuromuscular re-education: Pelvic floor contraction  training: Using the pelvic floor EMG on the rectal area with surface electrodes Resting level in sitting is 5 uv,  40 second contraction around 40 uv then decreased to 20 sec for 1 time then 10 times kept it around 30 uv Sit on potty squatty practicing blowing out to simulate having a bowel movement.     06/08/2022   Exercises: Strengthening: Recumbent bike level 3 for 6 minutes while assessing patient Hip extension machine 100# 3x10 Hip adduction machine 70# 3x 10 Hip abuction 70# 3x10 Contract the anus for 40 seconds 5 times Therapeutic activities: Functional strengthening activities: Educated patient on correct breathing into the abdomen to relax the pelvic floor for a bowel movement  Breathing out with force for 10 seconds to push stool out.     06/03/22 Neuromuscular re-education: Pelvic floor contraction training: 1/2 kneel with diagonals using green band 15x each way Lunge forward pulling on green band 20x  Squat with scapula retraction with green band 20x  Squat with rotation to work on core with pulling on green band Side step holding onto green band 20x each way Exercises: Strengthening: Recumbent bike for 8 minutes at level 1 while assess patient with interpreter Educated patient to walk 30 minutes per day to improve strength and endurance Leg press 110# 3/10  Hip extension machine 70# 3x10 Hip adduction machine 70# 3x 10      PATIENT EDUCATION: 06/03/2022 Education details: Access Code: [email or web address] Person educated: Patient Education method: Programmer, multimedia, Demonstration, Tactile cues, Verbal cues, and Handouts Education comprehension: verbalized understanding, returned demonstration, verbal cues required, tactile cues required, and needs further education  HOME EXERCISE PROGRAM: 06/03/22 Access Code: [email or web address] URL: [email or web address] Date: 06/03/2022 Prepared by: Earlie Counts   Exercises - Elliptical  - 1 x daily - 3 x weekly - 1 sets - 1 reps  - 5 minutes hold - Full Leg Press  - 1 x daily - 3 x weekly - 3 sets - 10 reps - Standing Cable Hip Extension  - 1 x daily - 3 x weekly - 3 sets - 10 reps - Seated Diaphragmatic Breathing  - 3 x daily - 7 x weekly - 3 sets - 10 reps - Scapular Retraction with Resistance  - 1 x daily - 3 x weekly - 3 sets - 10 reps - Squatting Anti-Rotation Press  - 1 x daily - 3 x weekly - 3 sets - 10 reps - Lunge and Press with Resistance  - 1 x daily - 3 x weekly - 2 sets - 10 reps - Half Kneeling Diagonal Chops with Resistance  - 1 x daily - 3 x weekly - 2 sets - 10 reps   ASSESSMENT:   CLINICAL IMPRESSION: Patient is a 27 y.o. male who was seen today for physical therapy  treatment for outlet dysfunction constipation.  Patient was able to relax to 5 uv. He is able to contract the anal sphincter for 40 seconds at 30 uv. He is able to simulate pushing stool out with keeping the anus relaxed.  Patient will benefit from skilled therapy to increase strength of the anal muscles and improve the coordination to be ready for normal bowel function after his reversal.    OBJECTIVE IMPAIRMENTS: decreased activity tolerance, decreased coordination, decreased endurance, decreased strength, and increased fascial restrictions.    ACTIVITY LIMITATIONS: toileting   PARTICIPATION LIMITATIONS:  out in the community   PERSONAL FACTORS: 1-2 comorbidities: Ostomy 05/13/2020; colon resection sigmoid 05/13/20; Sigmoid volvulus  are also affecting patient's functional outcome.    REHAB POTENTIAL: Excellent   CLINICAL DECISION MAKING: Stable/uncomplicated   EVALUATION COMPLEXITY: Low     GOALS: Goals reviewed with patient? Yes   SHORT TERM GOALS: Target date: 05/25/2022   Independent with initial HEP for pelvic floor and core strength.  Baseline: Goal status: Met 06/03/22       LONG TERM GOALS: Target date: 07/20/2022   Pt will have at least 4/5 MMT pelvic floor and able to sustain for 40 sec for ability to get to  bathroom  Baseline:  Goal status: INITIAL   2.  Pt will be able to fully relax pelvic floor for proper defacation to push the therapist finger out of the rectum to facilitate a bowel movement Baseline:  Goal status: ongoing 06/15/22   3.  Pt will understand proper toileting techniques and breathing techniques and be able to perform in seated position for proper defacatio  Baseline:  Goal status: Met 06/08/22   4.  Pt will be ind with advanced HEP for maintaining good bowel habits  Baseline:  Goal status: ongoing 06/15/22   5.  Patient understands how to exercise in the gym while not straining the pelvic floor to build his endurance of the pelvic floor muscles.  Baseline:  Goal status: Met 06/08/22     PLAN:   PT FREQUENCY: 1x/week   PT DURATION: 12 weeks   PLANNED INTERVENTIONS: Therapeutic exercises, Therapeutic activity, Neuromuscular re-education, Patient/Family education, Self Care, Biofeedback, and Manual therapy   PLAN FOR NEXT SESSION: place therapist finger in the rectum and patient pushes the finger out, check strength, go over  bowel health.    Eulis Foster, PT 06/15/22 11:44 AM

## 2022-06-24 ENCOUNTER — Encounter: Payer: Self-pay | Admitting: Physical Therapy

## 2022-06-24 ENCOUNTER — Ambulatory Visit: Payer: Medicaid Other | Attending: Surgery | Admitting: Physical Therapy

## 2022-06-24 DIAGNOSIS — M6281 Muscle weakness (generalized): Secondary | ICD-10-CM

## 2022-06-24 DIAGNOSIS — R279 Unspecified lack of coordination: Secondary | ICD-10-CM

## 2022-06-24 NOTE — Therapy (Addendum)
OUTPATIENT PHYSICAL THERAPY TREATMENT NOTE   Patient Name: Marius Betts MRN: [id number] DOB:07-May-1996, 27 y.o., male Today's Date: 06/24/2022  PCP: Alfredo Martinez, MD  REFERRING PROVIDER: Andria Meuse, MD    END OF SESSION:   PT End of Session - 06/24/22 1355     Visit Number 6    Date for PT Re-Evaluation 07/20/22    Authorization Type [employer]    Authorization Time Period 04/27/2022-06/26/2022    Authorization - Visit Number 5    Authorization - Number of Visits 10    PT Start Time 1400    PT Stop Time 1440    PT Time Calculation (min) 40 min    Activity Tolerance Patient tolerated treatment well    Behavior During Therapy [facility] for tasks assessed/performed             History reviewed. No pertinent past medical history. Past Surgical History:  Procedure Laterality Date   COLON RESECTION SIGMOID N/A 05/13/2020   Procedure: COLON RESECTION SIGMOID;  Surgeon: Diamantina Monks, MD;  Location: [facility] OR;  Service: General;  Laterality: N/A;   LAPAROTOMY N/A 05/13/2020   Procedure: EXPLORATORY LAPAROTOMY MOTION OF SPLENIC FLEXTION;  Surgeon: Diamantina Monks, MD;  Location: [facility] OR;  Service: General;  Laterality: N/A;   OSTOMY N/A 05/13/2020   Procedure: OSTOMY;  Surgeon: Diamantina Monks, MD;  Location: [facility] OR;  Service: General;  Laterality: N/A;   Patient Active Problem List   Diagnosis Date Noted   Encounter for health examination of refugee 09/06/2020   Status post partial colectomy 08/14/2020   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   Status post surgery 05/13/2020   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   REFERRING DIAG:  K59.02 (ICD-10-CM) - Outlet dysfunction constipation  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   Interpreter is present with treatment and his father.    PAIN:  Are you having pain? No   PRECAUTIONS: None   WEIGHT BEARING RESTRICTIONS: No   FALLS:  Has patient fallen in last 6 months? No   LIVING  ENVIRONMENT: Lives with: lives with their family   OCCUPATION: none   PLOF: Independent   PATIENT GOALS: to strengthen pelvic floor muscles   PERTINENT HISTORY:  Ostomy 05/13/2020; colon resection sigmoid 05/13/20; Sigmoid volvulus   BOWEL MOVEMENT: Has an ostomy.      URINATION: NO issues with bladder  Pain with urination: No       OBJECTIVE:    DIAGNOSTIC FINDINGS:  None   COGNITION: Overall cognitive status: Within functional limits for tasks assessed                          SENSATION: Light touch: Appears intact Proprioception: Appears intact       POSTURE: No Significant postural limitations   PELVIC ALIGNMENT:correct alignment     LOWER EXTREMITY ROM: Bilateral hip ROM is full     LOWER EXTREMITY MMT:   MMT Right eval Left eval  Hip flexion 4/5 4/5  Hip extension 4/5 4/5  Hip abduction 4/5 4/5  Hip adduction 4/5 4/5    PALPATION:   General  n/a                 External Perineal Exam intact                             Internal Pelvic Floor Patient had some difficulty  pushing  the therapist finger out of the anal canal. He has some restrictions around the anus.    Patient confirms identification and approves PT to assess internal pelvic floor and treatment Yes   PELVIC MMT:   MMT eval 06/24/22  Internal Anal Sphincter 4/5 5/5  External Anal Sphincter 4/5 5/5  Puborectalis 4/5 5/5  (Blank rows = not tested)         TONE: good     TODAY'S TREATMENT:   06/24/22 Manual: Soft tissue mobilization: Soft tissue work to left hamstring to improve mobility and reduce muscle spasm from exercise.  Internal pelvic floor techniques: No emotional/communication barriers or cognitive limitation. Patient is motivated to learn. Patient understands and agrees with treatment goals and plan. PT explains patient will be examined in standing, sitting, and lying down to see how their muscles and joints work. When they are ready, they will be asked to remove their underwear so PT can examine their perineum. The patient is also given the option of providing their own chaperone as one is not provided in our facility. The patient also has the right and is explained the right to defer or refuse any part of the evaluation or treatment including the internal exam. With the patient's consent, PT will use one gloved finger to gently assess the muscles of the pelvic floor, seeing how well it contracts and relaxes and if there is muscle symmetry. After, the patient will get dressed and PT and patient will discuss exam findings and plan of care. PT and patient discuss plan of care, schedule, attendance policy and HEP activities.  Therapist finger in the rectum working on anal sphincter contraction and the ability to push the therapist finger out of the rectum. Patient was able to bear down and push the therapist finger out of the rectum.  Exercises: Strengthening: Recumbent bike for 5 minutes with therapist assessing patient    06/15/22 Neuromuscular re-education: Pelvic floor contraction training: Using the pelvic floor EMG on the rectal area with surface electrodes Resting level in sitting is 5 uv,  40 second contraction around 40 uv then decreased to 20 sec for 1 time then 10 times kept it around 30 uv Sit on potty squatty practicing blowing out to simulate having a bowel movement.      06/08/2022   Exercises: Strengthening: Recumbent bike level 3 for 6 minutes while  assessing patient Hip extension machine 100# 3x10 Hip adduction machine 70# 3x 10 Hip abuction 70# 3x10 Contract the anus for 40 seconds 5 times Therapeutic activities: Functional strengthening activities: Educated patient on correct breathing into the abdomen to relax the pelvic floor for a bowel movement  Breathing out with force for 10 seconds to push stool out.         PATIENT EDUCATION: 06/03/2022 Education details: Access Code: [email or web address] Person educated: Patient Education method: Explanation, Demonstration, Tactile cues, Verbal cues, and Handouts Education comprehension: verbalized understanding, returned demonstration, verbal cues required, tactile cues required, and needs further education     HOME EXERCISE PROGRAM: 06/03/22 Access Code: [facility] URL: [email or web address] Date: 06/03/2022 Prepared by: Earlie Counts   Exercises - Elliptical  - 1 x daily - 3 x weekly - 1 sets - 1 reps - 5 minutes hold - Full Leg Press  - 1 x daily - 3 x weekly - 3 sets - 10 reps - Standing Cable Hip Extension  - 1 x daily - 3 x weekly - 3 sets - 10 reps - Seated Diaphragmatic Breathing  - 3 x daily - 7 x weekly - 3 sets - 10 reps - Scapular Retraction with Resistance  - 1 x daily - 3 x weekly - 3 sets - 10 reps - Squatting Anti-Rotation Press  - 1 x daily - 3 x weekly - 3 sets - 10 reps - Lunge and Press with Resistance  - 1 x daily - 3 x weekly - 2 sets - 10 reps - Half Kneeling Diagonal Chops with Resistance  - 1 x daily - 3 x weekly - 2 sets - 10 reps   ASSESSMENT:   CLINICAL IMPRESSION: Patient is a 27 y.o. male who was seen today for physical therapy  treatment for outlet dysfunction constipation.  Patient was able to push the therapist finger out of the rectum. She was able to relax the rectum as he was pushing the therapist out of the rectum. Pelvic floor strength increased to 5/5. She has increased endurance overall. He is able to contract the pelvic floor for for 40  seconds. His resting level is 5 uv. He is able to sit on potty squatty  blowing out to simulate having a bowel movement while the pelvic floor relaxed on the EMG unit.    OBJECTIVE IMPAIRMENTS: decreased activity tolerance, decreased coordination, decreased endurance, decreased strength, and increased fascial restrictions.    ACTIVITY LIMITATIONS: toileting   PARTICIPATION LIMITATIONS:  out in the community   PERSONAL FACTORS: 1-2 comorbidities: Ostomy 05/13/2020; colon resection sigmoid 05/13/20; Sigmoid volvulus  are also affecting patient's functional outcome.    REHAB POTENTIAL: Excellent   CLINICAL DECISION MAKING: Stable/uncomplicated   EVALUATION COMPLEXITY: Low     GOALS: Goals reviewed with patient? Yes   SHORT TERM GOALS: Target date: 05/25/2022   Independent with initial HEP for pelvic floor and core strength.  Baseline: Goal status: Met 06/03/22       LONG TERM GOALS: Target date: 07/20/2022   Pt will have at least 4/5 MMT pelvic floor and able to sustain for 40 sec for ability to get to bathroom  Baseline:  Goal  status: Met 06/24/22   2.  Pt will be able to fully relax pelvic floor for proper defacation to push the therapist finger out of the rectum to facilitate a bowel movement Baseline:  Goal status: Met 06/24/22   3.  Pt will understand proper toileting techniques and breathing techniques and be able to perform in seated position for proper defacatio  Baseline:  Goal status: Met 06/08/22   4.  Pt will be ind with advanced HEP for maintaining good bowel habits  Baseline:  Goal status: Met 06/24/22   5.  Patient understands how to exercise in the gym while not straining the pelvic floor to build his endurance of the pelvic floor muscles.  Baseline:  Goal status: Met 06/08/22     PLAN:   PT FREQUENCY: 1x/week   PT DURATION: 12 weeks   PLANNED INTERVENTIONS: Therapeutic exercises, Therapeutic activity, Neuromuscular re-education, Patient/Family education, Self  Care, Biofeedback, and Manual therapy   PLAN FOR NEXT SESSION: patient is to be discharged unless the MD feels different after he has the manometry test done on 07/01/22.     Eulis Foster, PT 06/24/22 2:43 PM   PHYSICAL THERAPY DISCHARGE SUMMARY  Visits from Start of Care: 6  Current functional level related to goals / functional outcomes: See above.    Remaining deficits: See above.    Education / Equipment: HEP   Patient agrees to discharge. Patient goals were met. Patient is being discharged due to meeting the stated rehab goals. Thank you for the referral. Eulis Foster, PT 09/15/22 3:58 PM

## 2022-07-01 ENCOUNTER — Encounter (HOSPITAL_COMMUNITY)
Admission: RE | Disposition: A | Discharge status: Home / Self Care | Payer: Self-pay | Source: Home / Self Care | Attending: Surgery

## 2022-07-01 ENCOUNTER — Ambulatory Visit (HOSPITAL_COMMUNITY)
Admission: RE | Admit: 2022-07-01 | Discharge: 2022-07-01 | Disposition: A | Discharge status: Home / Self Care | Payer: BLUE CROSS/BLUE SHIELD | Attending: Surgery | Admitting: Surgery

## 2022-07-01 ENCOUNTER — Encounter (HOSPITAL_COMMUNITY): Payer: Self-pay | Admitting: Surgery

## 2022-07-01 DIAGNOSIS — K5902 Outlet dysfunction constipation: Secondary | ICD-10-CM | POA: Insufficient documentation

## 2022-07-01 HISTORY — PX: ANAL RECTAL MANOMETRY: SHX6358

## 2022-07-01 SURGERY — MANOMETRY, ANORECTAL
Anesthesia: Choice

## 2022-07-01 NOTE — Progress Notes (Signed)
Anal rectal manometry performed per protocol without complications.  Patient tolerated well.  Balloon expulsion test performed per protocol without complications.  Patient tolerated well.  Expelled balloon after 1 minute.

## 2022-07-04 ENCOUNTER — Encounter (HOSPITAL_COMMUNITY): Payer: Self-pay | Admitting: Surgery

## 2022-07-06 NOTE — Progress Notes (Signed)
Late entry for 2/14. After patient discharge after manometry, Lattie Haw RN found that study was not successfully saved. Nurse notified MD and the patient and procedure was rescheduled for 2/21.

## 2022-07-08 ENCOUNTER — Ambulatory Visit (HOSPITAL_COMMUNITY)
Admission: RE | Admit: 2022-07-08 | Discharge: 2022-07-08 | Disposition: A | Discharge status: Home / Self Care | Payer: BLUE CROSS/BLUE SHIELD | Source: Ambulatory Visit | Attending: Surgery | Admitting: Surgery

## 2022-07-08 ENCOUNTER — Encounter (HOSPITAL_COMMUNITY)
Admission: RE | Disposition: A | Discharge status: Home / Self Care | Payer: Self-pay | Source: Ambulatory Visit | Attending: Surgery

## 2022-07-08 DIAGNOSIS — K5902 Outlet dysfunction constipation: Secondary | ICD-10-CM | POA: Diagnosis present

## 2022-07-08 DIAGNOSIS — Z933 Colostomy status: Secondary | ICD-10-CM | POA: Insufficient documentation

## 2022-07-08 HISTORY — PX: ANAL RECTAL MANOMETRY: SHX6358

## 2022-07-08 SURGERY — MANOMETRY, ANORECTAL

## 2022-07-08 NOTE — Progress Notes (Signed)
Anal rectal manometry performed per protocol without complications.  Patient tolerated well.  Balloon expulsion test performed per protocol without complications.  Patient tolerated well.  Expelled balloon after 1 minute.

## 2022-07-10 ENCOUNTER — Encounter (HOSPITAL_COMMUNITY): Payer: Self-pay | Admitting: Surgery

## 2022-09-14 ENCOUNTER — Ambulatory Visit: Payer: Self-pay | Admitting: Surgery

## 2022-09-14 DIAGNOSIS — Z01818 Encounter for other preprocedural examination: Secondary | ICD-10-CM

## 2022-09-14 NOTE — Progress Notes (Signed)
Sent message, via epic in basket, requesting orders in epic from surgeon.  

## 2022-09-18 NOTE — Patient Instructions (Signed)
DUE TO COVID-19 ONLY TWO VISITORS  (aged 27 and older)  ARE ALLOWED TO COME WITH YOU AND STAY IN THE WAITING ROOM ONLY DURING PRE OP AND PROCEDURE.   **NO VISITORS ARE ALLOWED IN THE SHORT STAY AREA OR RECOVERY ROOM!!**  IF YOU WILL BE ADMITTED INTO THE HOSPITAL YOU ARE ALLOWED ONLY FOUR SUPPORT PEOPLE DURING VISITATION HOURS ONLY (7 AM -8PM)   The support person(s) must pass our screening, gel in and out, and wear a mask at all times, including in the patient's room. Patients must also wear a mask when staff or their support person are in the room. Visitors GUEST BADGE MUST BE WORN VISIBLY  One adult visitor may remain with you overnight and MUST be in the room by 8 P.M.     Your procedure is scheduled on: 10/01/22   Report to [facility] Main Entrance    Report to admitting at : 9:45 AM   Call this number if you have problems the morning of surgery [phone]   Clear liquids starting the day before surgery until : 9:00 AM DAY OF SURGERY.Drink plenty clear fluids the day before surgery  Water Black Coffee (sugar ok, NO MILK/CREAM OR CREAMERS)  Tea (sugar ok, NO MILK/CREAM OR CREAMERS) regular and decaf                             Plain Jell-O (NO RED)                                           Fruit ices (not with fruit pulp, NO RED)                                     Popsicles (NO RED)                                                                  Juice: apple, WHITE grape, WHITE cranberry Sports drinks like Gatorade (NO RED)              Drink 2 Ensure drinks AT 10:00 PM the night before surgery.        The day of surgery:  Drink ONE (1) Pre-Surgery Clear Ensure at : 9:00 AM the morning of surgery. Drink in one sitting. Do not sip.  This drink was given to you during your hospital  pre-op appointment visit. Nothing else to drink after completing the  Pre-Surgery Clear Ensure or G2.          If you have questions, please contact your surgeon's office.  FOLLOW  BOWEL PREP AND ANY ADDITIONAL PRE OP INSTRUCTIONS YOU RECEIVED FROM YOUR SURGEON'S OFFICE!!!     Oral Hygiene is also important to reduce your risk of infection.                                    Remember - BRUSH YOUR TEETH THE MORNING OF SURGERY WITH YOUR REGULAR TOOTHPASTE  DENTURES WILL BE REMOVED PRIOR TO SURGERY PLEASE DO NOT APPLY "Poly grip" OR ADHESIVES!!!   Do NOT smoke after Midnight   Take these medicines the morning of surgery with A SIP OF WATER: N/A                              You may not have any metal on your body including hair pins, jewelry, and body piercing             Do not wear lotions, powders, perfumes/cologne, or deodorant              Men may shave face and neck.   Do not bring valuables to the hospital. [facility] IS NOT             RESPONSIBLE   FOR VALUABLES.   Contacts, glasses, or bridgework may not be worn into surgery.   Bring small overnight bag day of surgery.   DO NOT BRING YOUR HOME MEDICATIONS TO THE HOSPITAL. PHARMACY WILL DISPENSE MEDICATIONS LISTED ON YOUR MEDICATION LIST TO YOU DURING YOUR ADMISSION IN THE HOSPITAL!    Patients discharged on the day of surgery will not be allowed to drive home.  Someone NEEDS to stay with you for the first 24 hours after anesthesia.   Special Instructions: Bring a copy of your healthcare power of attorney and living will documents         the day of surgery if you haven't scanned them before.              Please read over the following fact sheets you were given: IF YOU HAVE QUESTIONS ABOUT YOUR PRE-OP INSTRUCTIONS PLEASE CALL [phone]    [facility] - Preparing for Surgery Before surgery, you can play an important role.  Because skin is not sterile, your skin needs to be as free of germs as possible.  You can reduce the number of germs on your skin by washing with CHG (chlorahexidine gluconate) soap before surgery.  CHG is an antiseptic cleaner which kills germs and bonds with the skin to continue  killing germs even after washing. Please DO NOT use if you have an allergy to CHG or antibacterial soaps.  If your skin becomes reddened/irritated stop using the CHG and inform your nurse when you arrive at Short Stay. Do not shave (including legs and underarms) for at least 48 hours prior to the first CHG shower.  You may shave your face/neck. Please follow these instructions carefully:  1.  Shower with CHG Soap the night before surgery and the  morning of Surgery.  2.  If you choose to wash your hair, wash your hair first as usual with your  normal  shampoo.  3.  After you shampoo, rinse your hair and body thoroughly to remove the  shampoo.                           4.  Use CHG as you would any other liquid soap.  You can apply chg directly  to the skin and wash                       Gently with a scrungie or clean washcloth.  5.  Apply the CHG Soap to your body ONLY FROM THE NECK DOWN.   Do not use on face/ open  Wound or open sores. Avoid contact with eyes, ears mouth and genitals (private parts).                       Wash face,  Genitals (private parts) with your normal soap.             6.  Wash thoroughly, paying special attention to the area where your surgery  will be performed.  7.  Thoroughly rinse your body with warm water from the neck down.  8.  DO NOT shower/wash with your normal soap after using and rinsing off  the CHG Soap.                9.  Pat yourself dry with a clean towel.            10.  Wear clean pajamas.            11.  Place clean sheets on your bed the night of your first shower and do not  sleep with pets. Day of Surgery : Do not apply any lotions/deodorants the morning of surgery.  Please wear clean clothes to the hospital/surgery center.  FAILURE TO FOLLOW THESE INSTRUCTIONS MAY RESULT IN THE CANCELLATION OF YOUR SURGERY PATIENT SIGNATURE_________________________________  NURSE  SIGNATURE__________________________________  ________________________________________________________________________  [facility]  An incentive spirometer is a tool that can help keep your lungs clear and active. This tool measures how well you are filling your lungs with each breath. Taking long deep breaths may help reverse or decrease the chance of developing breathing (pulmonary) problems (especially infection) following: A long period of time when you are unable to move or be active. BEFORE THE PROCEDURE  If the spirometer includes an indicator to show your best effort, your nurse or respiratory therapist will set it to a desired goal. If possible, sit up straight or lean slightly forward. Try not to slouch. Hold the incentive spirometer in an upright position. INSTRUCTIONS FOR USE  Sit on the edge of your bed if possible, or sit up as far as you can in bed or on a chair. Hold the incentive spirometer in an upright position. Breathe out normally. Place the mouthpiece in your mouth and seal your lips tightly around it. Breathe in slowly and as deeply as possible, raising the piston or the ball toward the top of the column. Hold your breath for 3-5 seconds or for as long as possible. Allow the piston or ball to fall to the bottom of the column. Remove the mouthpiece from your mouth and breathe out normally. Rest for a few seconds and repeat Steps 1 through 7 at least 10 times every 1-2 hours when you are awake. Take your time and take a few normal breaths between deep breaths. The spirometer may include an indicator to show your best effort. Use the indicator as a goal to work toward during each repetition. After each set of 10 deep breaths, practice coughing to be sure your lungs are clear. If you have an incision (the cut made at the time of surgery), support your incision when coughing by placing a pillow or rolled up towels firmly against it. Once you are able to get out of  bed, walk around indoors and cough well. You may stop using the incentive spirometer when instructed by your caregiver.  RISKS AND COMPLICATIONS Take your time so you do not get dizzy or light-headed. If you are in pain, you may need to take or ask  for pain medication before doing incentive spirometry. It is harder to take a deep breath if you are having pain. AFTER USE Rest and breathe slowly and easily. It can be helpful to keep track of a log of your progress. Your caregiver can provide you with a simple table to help with this. If you are using the spirometer at home, follow these instructions: [place] IF:  You are having difficultly using the spirometer. You have trouble using the spirometer as often as instructed. Your pain medication is not giving enough relief while using the spirometer. You develop fever of 100.5 F (38.1 C) or higher. SEEK IMMEDIATE MEDICAL CARE IF:  You cough up bloody sputum that had not been present before. You develop fever of 102 F (38.9 C) or greater. You develop worsening pain at or near the incision site. MAKE SURE YOU:  Understand these instructions. Will watch your condition. Will get help right away if you are not doing well or get worse. Document Released: 09/14/2006 Document Revised: 07/27/2011 Document Reviewed: 11/15/2006 [facility] Patient Information 2014 [place], [place].   ________________________________________________________________________

## 2022-09-21 ENCOUNTER — Other Ambulatory Visit: Payer: Self-pay

## 2022-09-21 ENCOUNTER — Encounter (HOSPITAL_COMMUNITY)
Admission: RE | Admit: 2022-09-21 | Discharge: 2022-09-21 | Disposition: A | Discharge status: Home / Self Care | Payer: BLUE CROSS/BLUE SHIELD | Source: Ambulatory Visit | Attending: Surgery | Admitting: Surgery

## 2022-09-21 ENCOUNTER — Encounter (HOSPITAL_COMMUNITY): Payer: Self-pay

## 2022-09-21 DIAGNOSIS — Z01818 Encounter for other preprocedural examination: Secondary | ICD-10-CM

## 2022-09-21 DIAGNOSIS — Z01812 Encounter for preprocedural laboratory examination: Secondary | ICD-10-CM | POA: Diagnosis present

## 2022-09-21 LAB — CBC WITH DIFFERENTIAL/PLATELET
Abs Immature Granulocytes: 0.05 10*3/uL (ref 0.00–0.07)
Basophils Absolute: 0 10*3/uL (ref 0.0–0.1)
Basophils Relative: 1 %
Eosinophils Absolute: 0 10*3/uL (ref 0.0–0.5)
Eosinophils Relative: 0 %
HCT: 44.5 % (ref 39.0–52.0)
Hemoglobin: 15 g/dL (ref 13.0–17.0)
Immature Granulocytes: 1 %
Lymphocytes Relative: 34 %
Lymphs Abs: 1.9 10*3/uL (ref 0.7–4.0)
MCH: 29.2 pg (ref 26.0–34.0)
MCHC: 33.7 g/dL (ref 30.0–36.0)
MCV: 86.7 fL (ref 80.0–100.0)
Monocytes Absolute: 0.7 10*3/uL (ref 0.1–1.0)
Monocytes Relative: 13 %
Neutro Abs: 2.9 10*3/uL (ref 1.7–7.7)
Neutrophils Relative %: 51 %
Platelets: 231 10*3/uL (ref 150–400)
RBC: 5.13 MIL/uL (ref 4.22–5.81)
RDW: 13.7 % (ref 11.5–15.5)
WBC: 5.7 10*3/uL (ref 4.0–10.5)
nRBC: 0 % (ref 0.0–0.2)

## 2022-09-21 LAB — BASIC METABOLIC PANEL
Anion gap: 11 (ref 5–15)
BUN: 11 mg/dL (ref 6–20)
CO2: 22 mmol/L (ref 22–32)
Calcium: 9 mg/dL (ref 8.9–10.3)
Chloride: 103 mmol/L (ref 98–111)
Creatinine, Ser: 0.97 mg/dL (ref 0.61–1.24)
GFR, Estimated: >60 mL/min (ref >60)
Glucose, Bld: 121 mg/dL — ABNORMAL HIGH (ref 70–99)
Potassium: 3.7 mmol/L (ref 3.5–5.1)
Sodium: 136 mmol/L (ref 135–145)

## 2022-09-21 LAB — TYPE AND SCREEN
ABO/RH(D): A POS
Antibody Screen: NEGATIVE

## 2022-09-21 NOTE — Progress Notes (Signed)
For Short Stay: COVID SWAB appointment date:  Bowel Prep reminder: Reviewed   For Anesthesia: PCP - Dr. Alfredo Martinez Cardiologist - N/A  Chest x-ray -  EKG -  Stress Test -  ECHO -  Cardiac Cath -  Pacemaker/ICD device last checked: Pacemaker orders received: Device Rep notified:  Spinal Cord Stimulator: N/A  Sleep Study - N/A CPAP -   Fasting Blood Sugar - N/A Checks Blood Sugar _____ times a day Date and result of last Hgb A1c-  Last dose of GLP1 agonist- N/A GLP1 instructions:   Last dose of SGLT-2 inhibitors-  SGLT-2 instructions:   Blood Thinner Instructions: N/A Aspirin Instructions: Last Dose:  Activity level: Can go up a flight of stairs and activities of daily living without stopping and without chest pain and/or shortness of breath   Able to exercise without chest pain and/or shortness of breath  Anesthesia review:   Patient denies shortness of breath, fever, cough and chest pain at PAT appointment   Patient verbalized understanding of instructions that were given to them at the PAT appointment. Patient was also instructed that they will need to review over the PAT instructions again at home before surgery.

## 2022-10-01 ENCOUNTER — Inpatient Hospital Stay (HOSPITAL_COMMUNITY): Payer: BLUE CROSS/BLUE SHIELD | Admitting: Certified Registered"

## 2022-10-01 ENCOUNTER — Encounter (HOSPITAL_COMMUNITY)
Admission: RE | Disposition: A | Discharge status: Home / Self Care | Payer: Self-pay | Source: Home / Self Care | Attending: Surgery

## 2022-10-01 ENCOUNTER — Other Ambulatory Visit: Payer: Self-pay

## 2022-10-01 ENCOUNTER — Encounter (HOSPITAL_COMMUNITY): Payer: Self-pay | Admitting: Surgery

## 2022-10-01 ENCOUNTER — Inpatient Hospital Stay (HOSPITAL_COMMUNITY)
Admission: RE | Admit: 2022-10-01 | Discharge: 2022-10-05 | DRG: 337 | Disposition: A | Discharge status: Home / Self Care | Payer: BLUE CROSS/BLUE SHIELD | Attending: Surgery | Admitting: Surgery

## 2022-10-01 DIAGNOSIS — R110 Nausea: Secondary | ICD-10-CM | POA: Diagnosis not present

## 2022-10-01 DIAGNOSIS — Z433 Encounter for attention to colostomy: Secondary | ICD-10-CM | POA: Diagnosis present

## 2022-10-01 DIAGNOSIS — K660 Peritoneal adhesions (postprocedural) (postinfection): Secondary | ICD-10-CM | POA: Diagnosis present

## 2022-10-01 DIAGNOSIS — Z9049 Acquired absence of other specified parts of digestive tract: Secondary | ICD-10-CM | POA: Diagnosis not present

## 2022-10-01 DIAGNOSIS — Z9889 Other specified postprocedural states: Principal | ICD-10-CM

## 2022-10-01 HISTORY — PX: FLEXIBLE SIGMOIDOSCOPY: SHX5431

## 2022-10-01 HISTORY — PX: XI ROBOTIC ASSISTED COLOSTOMY TAKEDOWN: SHX6828

## 2022-10-01 HISTORY — PX: LYSIS OF ADHESION: SHX5961

## 2022-10-01 LAB — ABO/RH: ABO/RH(D): A POS

## 2022-10-01 SURGERY — CLOSURE, COLOSTOMY, ROBOT-ASSISTED
Anesthesia: General

## 2022-10-01 MED ORDER — BUPIVACAINE HCL (PF) 0.25 % IJ SOLN
INTRAMUSCULAR | Status: DC | PRN
  Administered 2022-10-01: 30 mL

## 2022-10-01 MED ORDER — BISACODYL 5 MG PO TBEC: 20.0000 mg | DELAYED_RELEASE_TABLET | Freq: Once | ORAL | Status: DC

## 2022-10-01 MED ORDER — HEPARIN SODIUM (PORCINE) 5000 UNIT/ML IJ SOLN
5000.0000 [IU] | Freq: Once | INTRAMUSCULAR | Status: AC
  Administered 2022-10-01: 5000 [IU] via SUBCUTANEOUS
  Filled 2022-10-01: qty 1

## 2022-10-01 MED ORDER — INDOCYANINE GREEN 25 MG IV SOLR
INTRAVENOUS | Status: DC | PRN
  Administered 2022-10-01: 5 mg via INTRAVENOUS
  Administered 2022-10-01: 2.5 mg via INTRAVENOUS

## 2022-10-01 MED ORDER — KETAMINE HCL 10 MG/ML IJ SOLN
INTRAMUSCULAR | Status: DC | PRN
  Administered 2022-10-01: 20 mg via INTRAVENOUS
  Administered 2022-10-01: 10 mg via INTRAVENOUS

## 2022-10-01 MED ORDER — INDOCYANINE GREEN 25 MG IV SOLR
INTRAVENOUS | Status: AC
  Filled 2022-10-01: qty 10

## 2022-10-01 MED ORDER — LACTATED RINGERS IV SOLN: INTRAVENOUS | Status: DC

## 2022-10-01 MED ORDER — MIDAZOLAM HCL 2 MG/2ML IJ SOLN
INTRAMUSCULAR | Status: AC
  Filled 2022-10-01: qty 2

## 2022-10-01 MED ORDER — ACETAMINOPHEN 500 MG PO TABS
1000.0000 mg | ORAL_TABLET | ORAL | Status: AC
  Administered 2022-10-01: 1000 mg via ORAL
  Filled 2022-10-01: qty 2

## 2022-10-01 MED ORDER — HYDROMORPHONE HCL 1 MG/ML IJ SOLN
0.5000 mg | INTRAMUSCULAR | Status: DC | PRN
  Administered 2022-10-02: 0.5 mg via INTRAVENOUS
  Filled 2022-10-01: qty 0.5

## 2022-10-01 MED ORDER — ONDANSETRON HCL 4 MG/2ML IJ SOLN
INTRAMUSCULAR | Status: AC
  Filled 2022-10-01: qty 2

## 2022-10-01 MED ORDER — NEOMYCIN SULFATE 500 MG PO TABS
1000.0000 mg | ORAL_TABLET | ORAL | Status: DC
  Filled 2022-10-01: qty 2

## 2022-10-01 MED ORDER — ROCURONIUM BROMIDE 10 MG/ML (PF) SYRINGE
PREFILLED_SYRINGE | INTRAVENOUS | Status: DC | PRN
  Administered 2022-10-01 (×2): 10 mg via INTRAVENOUS
  Administered 2022-10-01: 60 mg via INTRAVENOUS

## 2022-10-01 MED ORDER — PROPOFOL 10 MG/ML IV BOLUS
INTRAVENOUS | Status: AC
  Filled 2022-10-01: qty 20

## 2022-10-01 MED ORDER — SUGAMMADEX SODIUM 200 MG/2ML IV SOLN
INTRAVENOUS | Status: DC | PRN
  Administered 2022-10-01: 150 mg via INTRAVENOUS

## 2022-10-01 MED ORDER — HYDROMORPHONE HCL 1 MG/ML IJ SOLN
0.2500 mg | INTRAMUSCULAR | Status: DC | PRN
  Administered 2022-10-01 (×2): 0.25 mg via INTRAVENOUS

## 2022-10-01 MED ORDER — SODIUM CHLORIDE 0.9 % IV SOLN
12.5000 mg | Freq: Four times a day (QID) | INTRAVENOUS | Status: DC | PRN
  Administered 2022-10-01 – 2022-10-03 (×2): 12.5 mg via INTRAVENOUS
  Filled 2022-10-01 (×2): qty 12.5

## 2022-10-01 MED ORDER — METRONIDAZOLE 500 MG PO TABS
1000.0000 mg | ORAL_TABLET | ORAL | Status: DC
  Filled 2022-10-01: qty 2

## 2022-10-01 MED ORDER — ENSURE SURGERY PO LIQD
237.0000 mL | Freq: Two times a day (BID) | ORAL | Status: DC
  Administered 2022-10-01 – 2022-10-04 (×6): 237 mL via ORAL

## 2022-10-01 MED ORDER — AMISULPRIDE (ANTIEMETIC) 5 MG/2ML IV SOLN
INTRAVENOUS | Status: AC
  Filled 2022-10-01: qty 2

## 2022-10-01 MED ORDER — BUPIVACAINE LIPOSOME 1.3 % IJ SUSP
INTRAMUSCULAR | Status: AC
  Filled 2022-10-01: qty 20

## 2022-10-01 MED ORDER — FENTANYL CITRATE (PF) 100 MCG/2ML IJ SOLN
INTRAMUSCULAR | Status: DC | PRN
  Administered 2022-10-01 (×2): 50 ug via INTRAVENOUS
  Administered 2022-10-01: 100 ug via INTRAVENOUS

## 2022-10-01 MED ORDER — PHENYLEPHRINE 80 MCG/ML (10ML) SYRINGE FOR IV PUSH (FOR BLOOD PRESSURE SUPPORT)
PREFILLED_SYRINGE | INTRAVENOUS | Status: AC
  Filled 2022-10-01: qty 10

## 2022-10-01 MED ORDER — LIDOCAINE 2% (20 MG/ML) 5 ML SYRINGE
INTRAMUSCULAR | Status: DC | PRN
  Administered 2022-10-01: 100 mg via INTRAVENOUS

## 2022-10-01 MED ORDER — ORAL CARE MOUTH RINSE: 15.0000 mL | Freq: Once | OROMUCOSAL | Status: AC

## 2022-10-01 MED ORDER — BUPIVACAINE LIPOSOME 1.3 % IJ SUSP
INTRAMUSCULAR | Status: DC | PRN
  Administered 2022-10-01: 20 mL

## 2022-10-01 MED ORDER — ALVIMOPAN 12 MG PO CAPS
12.0000 mg | ORAL_CAPSULE | Freq: Two times a day (BID) | ORAL | Status: DC
  Administered 2022-10-02 – 2022-10-03 (×3): 12 mg via ORAL
  Filled 2022-10-01 (×3): qty 1

## 2022-10-01 MED ORDER — DIPHENHYDRAMINE HCL 50 MG/ML IJ SOLN: 12.5000 mg | Freq: Four times a day (QID) | INTRAMUSCULAR | Status: DC | PRN

## 2022-10-01 MED ORDER — ACETAMINOPHEN 500 MG PO TABS
1000.0000 mg | ORAL_TABLET | Freq: Four times a day (QID) | ORAL | Status: DC
  Administered 2022-10-01 – 2022-10-05 (×13): 1000 mg via ORAL
  Filled 2022-10-01 (×15): qty 2

## 2022-10-01 MED ORDER — CHLORHEXIDINE GLUCONATE 0.12 % MT SOLN
15.0000 mL | Freq: Once | OROMUCOSAL | Status: AC
  Administered 2022-10-01: 15 mL via OROMUCOSAL

## 2022-10-01 MED ORDER — CHLORHEXIDINE GLUCONATE CLOTH 2 % EX PADS: 6.0000 | MEDICATED_PAD | Freq: Once | CUTANEOUS | Status: DC

## 2022-10-01 MED ORDER — SODIUM CHLORIDE 0.9 % IV SOLN
2.0000 g | INTRAVENOUS | Status: AC
  Administered 2022-10-01: 2 g via INTRAVENOUS
  Filled 2022-10-01: qty 2

## 2022-10-01 MED ORDER — ACETAMINOPHEN 10 MG/ML IV SOLN: 1000.0000 mg | Freq: Once | INTRAVENOUS | Status: DC | PRN

## 2022-10-01 MED ORDER — PHENYLEPHRINE 80 MCG/ML (10ML) SYRINGE FOR IV PUSH (FOR BLOOD PRESSURE SUPPORT)
PREFILLED_SYRINGE | INTRAVENOUS | Status: DC | PRN
  Administered 2022-10-01 (×2): 80 ug via INTRAVENOUS

## 2022-10-01 MED ORDER — POLYETHYLENE GLYCOL 3350 17 GM/SCOOP PO POWD: 1.0000 | Freq: Once | ORAL | Status: DC

## 2022-10-01 MED ORDER — ALUM & MAG HYDROXIDE-SIMETH 200-200-20 MG/5ML PO SUSP: 30.0000 mL | Freq: Four times a day (QID) | ORAL | Status: DC | PRN

## 2022-10-01 MED ORDER — BUPIVACAINE HCL 0.25 % IJ SOLN
INTRAMUSCULAR | Status: AC
  Filled 2022-10-01: qty 1

## 2022-10-01 MED ORDER — SIMETHICONE 80 MG PO CHEW: 40.0000 mg | CHEWABLE_TABLET | Freq: Four times a day (QID) | ORAL | Status: DC | PRN

## 2022-10-01 MED ORDER — AMISULPRIDE (ANTIEMETIC) 5 MG/2ML IV SOLN
10.0000 mg | Freq: Once | INTRAVENOUS | Status: AC | PRN
  Administered 2022-10-01: 10 mg via INTRAVENOUS

## 2022-10-01 MED ORDER — DEXAMETHASONE SODIUM PHOSPHATE 10 MG/ML IJ SOLN
INTRAMUSCULAR | Status: DC | PRN
  Administered 2022-10-01: 4 mg via INTRAVENOUS

## 2022-10-01 MED ORDER — ALVIMOPAN 12 MG PO CAPS
12.0000 mg | ORAL_CAPSULE | ORAL | Status: AC
  Administered 2022-10-01: 12 mg via ORAL
  Filled 2022-10-01: qty 1

## 2022-10-01 MED ORDER — FENTANYL CITRATE (PF) 100 MCG/2ML IJ SOLN
INTRAMUSCULAR | Status: AC
  Filled 2022-10-01: qty 2

## 2022-10-01 MED ORDER — 0.9 % SODIUM CHLORIDE (POUR BTL) OPTIME
TOPICAL | Status: DC | PRN
  Administered 2022-10-01: 2000 mL

## 2022-10-01 MED ORDER — TRAMADOL HCL 50 MG PO TABS
50.0000 mg | ORAL_TABLET | Freq: Four times a day (QID) | ORAL | Status: DC | PRN
  Administered 2022-10-02: 50 mg via ORAL
  Filled 2022-10-01: qty 1

## 2022-10-01 MED ORDER — PROPOFOL 10 MG/ML IV BOLUS
INTRAVENOUS | Status: DC | PRN
  Administered 2022-10-01: 200 mg via INTRAVENOUS

## 2022-10-01 MED ORDER — HEPARIN SODIUM (PORCINE) 5000 UNIT/ML IJ SOLN
5000.0000 [IU] | Freq: Three times a day (TID) | INTRAMUSCULAR | Status: DC
  Administered 2022-10-01 – 2022-10-05 (×10): 5000 [IU] via SUBCUTANEOUS
  Filled 2022-10-01 (×10): qty 1

## 2022-10-01 MED ORDER — IBUPROFEN 400 MG PO TABS
600.0000 mg | ORAL_TABLET | Freq: Four times a day (QID) | ORAL | Status: DC | PRN
  Administered 2022-10-03: 600 mg via ORAL
  Filled 2022-10-01: qty 1

## 2022-10-01 MED ORDER — LIDOCAINE HCL (PF) 2 % IJ SOLN
INTRAMUSCULAR | Status: AC
  Filled 2022-10-01: qty 5

## 2022-10-01 MED ORDER — MIDAZOLAM HCL 2 MG/2ML IJ SOLN
INTRAMUSCULAR | Status: DC | PRN
  Administered 2022-10-01: 2 mg via INTRAVENOUS

## 2022-10-01 MED ORDER — BUPIVACAINE LIPOSOME 1.3 % IJ SUSP: 20.0000 mL | Freq: Once | INTRAMUSCULAR | Status: DC

## 2022-10-01 MED ORDER — ENSURE PRE-SURGERY PO LIQD
296.0000 mL | Freq: Once | ORAL | Status: DC
  Filled 2022-10-01: qty 296

## 2022-10-01 MED ORDER — ROCURONIUM BROMIDE 10 MG/ML (PF) SYRINGE
PREFILLED_SYRINGE | INTRAVENOUS | Status: AC
  Filled 2022-10-01: qty 10

## 2022-10-01 MED ORDER — HYDRALAZINE HCL 20 MG/ML IJ SOLN: 10.0000 mg | INTRAMUSCULAR | Status: DC | PRN

## 2022-10-01 MED ORDER — DEXAMETHASONE SODIUM PHOSPHATE 10 MG/ML IJ SOLN
INTRAMUSCULAR | Status: AC
  Filled 2022-10-01: qty 1

## 2022-10-01 MED ORDER — ENSURE PRE-SURGERY PO LIQD
592.0000 mL | Freq: Once | ORAL | Status: DC
  Filled 2022-10-01: qty 592

## 2022-10-01 MED ORDER — DIPHENHYDRAMINE HCL 12.5 MG/5ML PO ELIX: 12.5000 mg | ORAL_SOLUTION | Freq: Four times a day (QID) | ORAL | Status: DC | PRN

## 2022-10-01 MED ORDER — ONDANSETRON HCL 4 MG PO TABS: 4.0000 mg | ORAL_TABLET | Freq: Four times a day (QID) | ORAL | Status: DC | PRN

## 2022-10-01 MED ORDER — KETAMINE HCL 50 MG/5ML IJ SOSY
PREFILLED_SYRINGE | INTRAMUSCULAR | Status: AC
  Filled 2022-10-01: qty 5

## 2022-10-01 MED ORDER — LACTATED RINGERS IR SOLN
Status: DC | PRN
  Administered 2022-10-01: 1000 mL

## 2022-10-01 MED ORDER — ONDANSETRON HCL 4 MG/2ML IJ SOLN
INTRAMUSCULAR | Status: DC | PRN
  Administered 2022-10-01: 4 mg via INTRAVENOUS

## 2022-10-01 MED ORDER — LACTATED RINGERS IV SOLN: INTRAVENOUS | Status: DC | PRN

## 2022-10-01 MED ORDER — ONDANSETRON HCL 4 MG/2ML IJ SOLN
4.0000 mg | Freq: Four times a day (QID) | INTRAMUSCULAR | Status: DC | PRN
  Administered 2022-10-01 – 2022-10-03 (×2): 4 mg via INTRAVENOUS
  Filled 2022-10-01 (×2): qty 2

## 2022-10-01 MED ORDER — HYDROMORPHONE HCL 1 MG/ML IJ SOLN
INTRAMUSCULAR | Status: AC
  Filled 2022-10-01: qty 1

## 2022-10-01 SURGICAL SUPPLY — 116 items
ADH SKN CLS APL DERMABOND .7 (GAUZE/BANDAGES/DRESSINGS) ×1
APL PRP STRL LF DISP 70% ISPRP (MISCELLANEOUS) ×1
APPLIER CLIP 5 13 M/L LIGAMAX5 (MISCELLANEOUS)
APPLIER CLIP ROT 10 11.4 M/L (STAPLE)
APR CLP MED LRG 11.4X10 (STAPLE)
APR CLP MED LRG 5 ANG JAW (MISCELLANEOUS)
BAG COUNTER SPONGE SURGICOUNT (BAG) IMPLANT
BAG SPNG CNTER NS LX DISP (BAG)
BLADE EXTENDED COATED 6.5IN (ELECTRODE) ×1 IMPLANT
CANNULA REDUCER 12-8 DVNC XI (CANNULA) ×1 IMPLANT
CELLS DAT CNTRL 66122 CELL SVR (MISCELLANEOUS) IMPLANT
CHLORAPREP W/TINT 26 (MISCELLANEOUS) ×1 IMPLANT
CLIP APPLIE 5 13 M/L LIGAMAX5 (MISCELLANEOUS) IMPLANT
CLIP APPLIE ROT 10 11.4 M/L (STAPLE) IMPLANT
CLIP LIGATING HEM O LOK PURPLE (MISCELLANEOUS) IMPLANT
CLIP LIGATING HEMO O LOK GREEN (MISCELLANEOUS) IMPLANT
COVER SURGICAL LIGHT HANDLE (MISCELLANEOUS) ×2 IMPLANT
COVER TIP SHEARS 8 DVNC (MISCELLANEOUS) ×1 IMPLANT
DEFOGGER SCOPE WARMER CLEARIFY (MISCELLANEOUS) ×1 IMPLANT
DERMABOND ADVANCED .7 DNX12 (GAUZE/BANDAGES/DRESSINGS) IMPLANT
DEVICE TROCAR PUNCTURE CLOSURE (ENDOMECHANICALS) IMPLANT
DRAIN CHANNEL 19F RND (DRAIN) ×1 IMPLANT
DRAPE ARM DVNC X/XI (DISPOSABLE) ×4 IMPLANT
DRAPE COLUMN DVNC XI (DISPOSABLE) ×1 IMPLANT
DRAPE SURG IRRIG POUCH 19X23 (DRAPES) ×1 IMPLANT
DRIVER NDL LRG 8 DVNC XI (INSTRUMENTS) ×1 IMPLANT
DRIVER NDLE LRG 8 DVNC XI (INSTRUMENTS) ×1 IMPLANT
DRSG OPSITE POSTOP 4X10 (GAUZE/BANDAGES/DRESSINGS) IMPLANT
DRSG OPSITE POSTOP 4X6 (GAUZE/BANDAGES/DRESSINGS) IMPLANT
DRSG OPSITE POSTOP 4X8 (GAUZE/BANDAGES/DRESSINGS) IMPLANT
DRSG TEGADERM 2-3/8X2-3/4 SM (GAUZE/BANDAGES/DRESSINGS) ×5 IMPLANT
DRSG TEGADERM 4X4.75 (GAUZE/BANDAGES/DRESSINGS) ×1 IMPLANT
ELECT REM PT RETURN 15FT ADLT (MISCELLANEOUS) ×1 IMPLANT
ENDOLOOP SUT PDS II  0 18 (SUTURE)
ENDOLOOP SUT PDS II 0 18 (SUTURE) IMPLANT
EVACUATOR SILICONE 100CC (DRAIN) ×1 IMPLANT
GAUZE PAD ABD 7.5X8 STRL (GAUZE/BANDAGES/DRESSINGS) IMPLANT
GAUZE SPONGE 2X2 8PLY STRL LF (GAUZE/BANDAGES/DRESSINGS) ×1 IMPLANT
GAUZE SPONGE 4X4 12PLY STRL (GAUZE/BANDAGES/DRESSINGS) IMPLANT
GLOVE BIO SURGEON STRL SZ7.5 (GLOVE) ×3 IMPLANT
GLOVE INDICATOR 8.0 STRL GRN (GLOVE) ×3 IMPLANT
GOWN SRG XL LVL 4 BRTHBL STRL (GOWNS) ×1 IMPLANT
GOWN STRL NON-REIN XL LVL4 (GOWNS) ×1
GOWN STRL REUS W/ TWL XL LVL3 (GOWN DISPOSABLE) ×5 IMPLANT
GOWN STRL REUS W/TWL XL LVL3 (GOWN DISPOSABLE) ×5
GRASPER SUT TROCAR 14GX15 (MISCELLANEOUS) IMPLANT
GRASPER TIP-UP FEN DVNC XI (INSTRUMENTS) ×1 IMPLANT
HOLDER FOLEY CATH W/STRAP (MISCELLANEOUS) ×1 IMPLANT
IRRIG SUCT STRYKERFLOW 2 WTIP (MISCELLANEOUS) ×1
IRRIGATION SUCT STRKRFLW 2 WTP (MISCELLANEOUS) ×1 IMPLANT
KIT PROCEDURE DVNC SI (MISCELLANEOUS) IMPLANT
KIT TURNOVER KIT A (KITS) IMPLANT
NDL INSUFFLATION 14GA 120MM (NEEDLE) ×1 IMPLANT
NEEDLE INSUFFLATION 14GA 120MM (NEEDLE) ×1 IMPLANT
PACK CARDIOVASCULAR III (CUSTOM PROCEDURE TRAY) ×1 IMPLANT
PACK COLON (CUSTOM PROCEDURE TRAY) ×1 IMPLANT
PAD POSITIONING PINK XL (MISCELLANEOUS) ×1 IMPLANT
PENCIL SMOKE EVACUATOR (MISCELLANEOUS) IMPLANT
PROTECTOR NERVE ULNAR (MISCELLANEOUS) ×2 IMPLANT
RELOAD STAPLE 45 3.5 BLU DVNC (STAPLE) IMPLANT
RELOAD STAPLE 45 4.3 GRN DVNC (STAPLE) IMPLANT
RELOAD STAPLE 60 3.5 BLU DVNC (STAPLE) IMPLANT
RELOAD STAPLE 60 4.3 GRN DVNC (STAPLE) IMPLANT
RELOAD STAPLER 3.5X45 BLU DVNC (STAPLE) IMPLANT
RELOAD STAPLER 3.5X60 BLU DVNC (STAPLE) IMPLANT
RELOAD STAPLER 4.3X45 GRN DVNC (STAPLE) IMPLANT
RELOAD STAPLER 4.3X60 GRN DVNC (STAPLE) IMPLANT
RETRACTOR WND ALEXIS 18 MED (MISCELLANEOUS) IMPLANT
RTRCTR WOUND ALEXIS 18CM MED (MISCELLANEOUS)
SCISSORS LAP 5X35 DISP (ENDOMECHANICALS) IMPLANT
SCISSORS MNPLR CVD DVNC XI (INSTRUMENTS) ×1 IMPLANT
SEAL UNIV 5-12 XI (MISCELLANEOUS) ×4 IMPLANT
SEALER VESSEL EXT DVNC XI (MISCELLANEOUS) ×1 IMPLANT
SLEEVE ADV FIXATION 5X100MM (TROCAR) IMPLANT
SOL ELECTROSURG ANTI STICK (MISCELLANEOUS) ×1
SOLUTION ELECTROSURG ANTI STCK (MISCELLANEOUS) ×1 IMPLANT
SPIKE FLUID TRANSFER (MISCELLANEOUS) ×1 IMPLANT
STAPLER 60 SUREFORM DVNC (STAPLE) IMPLANT
STAPLER ECHELON POWER CIR 29 (STAPLE) IMPLANT
STAPLER ECHELON POWER CIR 31 (STAPLE) IMPLANT
STAPLER RELOAD 3.5X45 BLU DVNC (STAPLE)
STAPLER RELOAD 3.5X60 BLU DVNC (STAPLE)
STAPLER RELOAD 4.3X45 GRN DVNC (STAPLE)
STAPLER RELOAD 4.3X60 GRN DVNC (STAPLE)
STOPCOCK 4 WAY LG BORE MALE ST (IV SETS) ×2 IMPLANT
SURGILUBE 2OZ TUBE FLIPTOP (MISCELLANEOUS) ×1 IMPLANT
SUT MNCRL AB 4-0 PS2 18 (SUTURE) ×1 IMPLANT
SUT PDS AB 1 CT1 27 (SUTURE) IMPLANT
SUT PDS AB 1 TP1 96 (SUTURE) IMPLANT
SUT PROLENE 0 CT 2 (SUTURE) IMPLANT
SUT PROLENE 2 0 KS (SUTURE) ×1 IMPLANT
SUT PROLENE 2 0 SH DA (SUTURE) IMPLANT
SUT SILK 2 0 (SUTURE)
SUT SILK 2 0 SH CR/8 (SUTURE) IMPLANT
SUT SILK 2-0 18XBRD TIE 12 (SUTURE) IMPLANT
SUT SILK 3 0 (SUTURE) ×1
SUT SILK 3 0 SH CR/8 (SUTURE) ×1 IMPLANT
SUT SILK 3-0 18XBRD TIE 12 (SUTURE) ×1 IMPLANT
SUT V-LOC BARB 180 2/0GR6 GS22 (SUTURE)
SUT VIC AB 3-0 SH 18 (SUTURE) IMPLANT
SUT VIC AB 3-0 SH 27 (SUTURE)
SUT VIC AB 3-0 SH 27XBRD (SUTURE) IMPLANT
SUT VICRYL 0 UR6 27IN ABS (SUTURE) ×1 IMPLANT
SUTURE V-LC BRB 180 2/0GR6GS22 (SUTURE) IMPLANT
SYR 10ML LL (SYRINGE) ×1 IMPLANT
SYS LAPSCP GELPORT 120MM (MISCELLANEOUS)
SYS WOUND ALEXIS 18CM MED (MISCELLANEOUS) ×1
SYSTEM LAPSCP GELPORT 120MM (MISCELLANEOUS) IMPLANT
SYSTEM WOUND ALEXIS 18CM MED (MISCELLANEOUS) ×1 IMPLANT
TAPE PAPER 3X10 WHT MICROPORE (GAUZE/BANDAGES/DRESSINGS) IMPLANT
TAPE UMBILICAL 1/8 X36 TWILL (MISCELLANEOUS) ×1 IMPLANT
TOWEL OR NON WOVEN STRL DISP B (DISPOSABLE) ×1 IMPLANT
TRAY FOLEY MTR SLVR 16FR STAT (SET/KITS/TRAYS/PACK) ×1 IMPLANT
TROCAR ADV FIXATION 5X100MM (TROCAR) ×1 IMPLANT
TUBING CONNECTING 10 (TUBING) ×3 IMPLANT
TUBING INSUFFLATION 10FT LAP (TUBING) ×1 IMPLANT

## 2022-10-01 NOTE — Anesthesia Preprocedure Evaluation (Addendum)
Anesthesia Evaluation  Patient identified by MRN, date of birth, ID band Patient awake    Reviewed: Allergy & Precautions, NPO status , Patient's Chart, lab work & pertinent test results  History of Anesthesia Complications Negative for: history of anesthetic complications  Airway Mallampati: II  TM Distance: >3 FB Neck ROM: Full    Dental no notable dental hx.    Pulmonary neg pulmonary ROS   Pulmonary exam normal        Cardiovascular negative cardio ROS Normal cardiovascular exam     Neuro/Psych negative neurological ROS     GI/Hepatic Neg liver ROS,,,COLOSTOMY STATUS; HISTORY OF SIGMOID VOLVULUS   Endo/Other  negative endocrine ROS    Renal/GU negative Renal ROS     Musculoskeletal negative musculoskeletal ROS (+)    Abdominal   Peds  Hematology negative hematology ROS (+)   Anesthesia Other Findings Day of surgery medications reviewed with patient.  Reproductive/Obstetrics                             Anesthesia Physical Anesthesia Plan  ASA: 2  Anesthesia Plan: General   Post-op Pain Management: Ofirmev IV (intra-op)* and Toradol IV (intra-op)*   Induction: Intravenous  PONV Risk Score and Plan: 3 and Treatment may vary due to age or medical condition, Midazolam, Dexamethasone and Ondansetron  Airway Management Planned: Oral ETT  Additional Equipment: None  Intra-op Plan:   Post-operative Plan: Extubation in OR  Informed Consent: I have reviewed the patients History and Physical, chart, labs and discussed the procedure including the risks, benefits and alternatives for the proposed anesthesia with the patient or authorized representative who has indicated his/her understanding and acceptance.     Dental advisory given and Interpreter used for interveiw  Plan Discussed with: CRNA  Anesthesia Plan Comments:        Anesthesia Quick Evaluation

## 2022-10-01 NOTE — Anesthesia Procedure Notes (Signed)
Procedure Name: Intubation Date/Time: 10/01/2022 10:49 AM  Performed by: Nelle Don, CRNAPre-anesthesia Checklist: Patient identified, Emergency Drugs available, Suction available and Patient being monitored Patient Re-evaluated:Patient Re-evaluated prior to induction Oxygen Delivery Method: Circle system utilized Preoxygenation: Pre-oxygenation with 100% oxygen Induction Type: IV induction Ventilation: Mask ventilation without difficulty Laryngoscope Size: Mac and 4 Grade View: Grade I Tube type: Oral Tube size: 7.5 mm Number of attempts: 1 Airway Equipment and Method: Stylet Placement Confirmation: ETT inserted through vocal cords under direct vision, positive ETCO2 and breath sounds checked- equal and bilateral Secured at: 23 cm Tube secured with: Tape Dental Injury: Teeth and Oropharynx as per pre-operative assessment

## 2022-10-01 NOTE — H&P (Signed)
CC: Here today for surgery  HPI: Todd Jordan is an 27 y.o. 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 Benign lymph nodes.  Completed GGE, colonoscopy and anorectal manometry - all at separate facilites but in [facility]where.   He reports that prior to having had his emergent surgery, he would generally have 3-4 bowel movements per day and this has been his baseline as long as he can remember. He denies any history of blood in his stool. He denies any history of abdominal pain, nausea, vomiting, or bloating. He denied any issues with the evacuation and states he was able to have a bowel movement when the urge arose generally within minutes.  GGE 08/01/2021-postsurgical changes of Hartman's pouch, no evidence of contrast leak.  [facility]: [facility] Claudia Pollock mucosa in the terminal ileum, ascending colon, transverse colon and proximal descending colon.  Anorectal Manometry - Completed at [facility] 03/20/2021: Resting pressure: normal Squeeze anal sphincter pressure normal Unable to sustain squeeze x 20 seconds RAIR present Sensation abnormal at 100cc (normal <20 cc) Urge at 150 cc (normal Discomfort at 200 cc (normal 250 cc) Unable to expel 50 cc balloon after 5 minutes Resting pressure is normal. EAS is able to generate and maintain squeeze. There is adequate contraction of the EAS during cough. There is no evidence of asymmetry on 2D or 3D imaging. There is adequate relaxation of the EAS during to strain trials however unable to expel the balloon. First sensation is reduced.  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 He has been doing well. He was seen back in the office for follow-up with his father and another one of his brothers whom  has served as her medical interpreter by choice. Denies any complaints at present. Colostomy is working well. Appetite is good. He has no complaints. He remains interested in having his colostomy reversed if in fact it is safe.  Denies any changes in his health or health hx. Did have an insurance hiccup that initially was out of network. His sister indicates to me today that they recently changed to [facility] and that she called yeterday and was told everything was green lighted for today.  Tolerated bowel prep with satisfactory result.  [facility] medical interpreter present for all the above today as well    PMH: Sigmoid volvulus with ischemic sigmoid  PSH: As above  FHx: Denies any known family history of colorectal, breast, endometrial or ovarian cancer   Social Hx: Denies use of tobacco/EtOH/illicit drug.   He denies any changes in health or health history since we met in the office. No new medications/allergies. He states he is ready for surgery today.  History reviewed. No pertinent past medical history.  Past Surgical History:  Procedure Laterality Date   ANAL RECTAL MANOMETRY N/A 07/01/2022   Procedure: ANO RECTAL MANOMETRY;  Surgeon: Andria Meuse, MD;  Location: [facility];  Service: General;  Laterality: N/A;   ANAL RECTAL MANOMETRY N/A 07/08/2022   Procedure: ANO RECTAL MANOMETRY;  Surgeon: Andria Meuse, MD;  Location: [facility];  Service: General;  Laterality: N/A;   COLON RESECTION SIGMOID N/A 05/13/2020   Procedure: COLON RESECTION SIGMOID;  Surgeon: Diamantina Monks, MD;  Location: [facility] OR;  Service: General;  Laterality: N/A;   LAPAROTOMY N/A 05/13/2020   Procedure: EXPLORATORY LAPAROTOMY MOTION OF SPLENIC  FLEXTION;  Surgeon: Diamantina Monks, MD;  Location: [facility] OR;  Service: General;  Laterality: N/A;   OSTOMY N/A 05/13/2020   Procedure: OSTOMY;  Surgeon: Diamantina Monks, MD;  Location: [facility] OR;  Service: General;  Laterality: N/A;    History reviewed.  No pertinent family history.  Social:  reports that he has never smoked. He has never used smokeless tobacco. He reports that he does not drink alcohol and does not use drugs.  Allergies: No Known Allergies  Medications: I have reviewed the patient's current medications.  No results found for this or any previous visit (from the past 48 hour(s)).  No results found.   PE Blood pressure 132/84, pulse 91, temperature 98.2 F (36.8 C), temperature source Oral, resp. rate 15, height 6' 2.41" (1.89 m), weight 73 kg, SpO2 98 %. Constitutional: NAD; conversant Eyes: Moist conjunctiva Lungs: Normal respiratory effort CV: RRR GI: Abd soft, NT/ND Psychiatric: Appropriate affect  No results found for this or any previous visit (from the past 48 hour(s)).  No results found.  A/P: Todd Jordan is an 27 y.o. male with hx of sigmoid volvulus --> exlap/Hartmann's 05/13/20  -GGE clear 08/01/21 ([facility]) -[facility]) -Anorectal manometry shows relaxation but unable to expel balloon; RAIR present. -Follow-up manometry shows successful expulsion of balloon; +relaxation -Undergoing pelvic floor PT - completed with success  -High sphincter tone but relaxes on ARM and with pelvic floor PT. We discussed rationale therein and our concerns. They have expressed understanding and agreement with the plan.   -ARM completed - now able to relax to the degree he can expel the balloon.  -The anatomy and physiology of the GI tract was reviewed with the patient as it pertains to his anatomy. He remains motivated to undergo colostomy reversal for quality-of-life reasons. -We have discussed various different treatment options going forward including surgery (the most definitive) to address this -we have discussed robotic assisted colostomy takedown; flexible sigmoidoscopy; lysis of adhesions.  -We have also reviewed scenarios where an open procedure may be necessary. -The  planned procedure, material risks (including, but not limited to, pain, bleeding, infection, scarring, need for blood transfusion, damage to surrounding structures- blood vessels/nerves/viscus/organs, inability to reverse stoma, damage to ureter, urine leak, leak from anastomosis, need for additional procedures, sexual dysfunction, scenarios where a stoma may be necessary and where it may be permanent, worsening of pre-existing medical conditions, hernia, recurrence, pneumonia, heart attack, stroke, death) benefits and alternatives to surgery were discussed at length. The patient's questions were answered to his satisfaction, he voiced understanding and elected to proceed with surgery. Additionally, we discussed typical postoperative expectations and the recovery process.   Marin Olp, MD [facility], [facility]

## 2022-10-01 NOTE — Op Note (Signed)
PATIENT: Todd Jordan  27 y.o. male  Patient Care Team: Alfredo Martinez, MD as PCP - General (Family Medicine) Patient, No Pcp Per (General Practice)  PREOP DIAGNOSIS: COLOSTOMY STATUS; HISTORY OF SIGMOID VOLVULUS  POSTOP DIAGNOSIS: COLOSTOMY STATUS; HISTORY OF SIGMOID VOLVULUS  PROCEDURE:  Robotic assisted takedown of end descending colostomy Takedown of splenic flexure Lysis of adhesions x 30 minutes Intraoperative assessment of perfusion using ICG fluorescence imaging Flexible sigmoidoscopy Bilateral transversus abdominus plane (TAP) blocks  SURGEON: Stephanie Coup. Cliffton Asters, MD  ASSISTANT: Karie Soda, MD  ANESTHESIA: General endotracheal  EBL: 50 mL Total I/O In: 100 [IV Piggyback:100] Out: 350 [Urine:300; Blood:50]  DRAINS: None  SPECIMEN: Colostomy  COUNTS: Sponge, needle and instrument counts were reported correct x2  FINDINGS: Healthy rectal stump.  Omental containing adhesions.  Robotic assisted colostomy takedown carried out after mobilization of splenic flexure.  A well perfused, tension free, hemostatic, air tight 29 mm EEA colorectal anastomosis fashioned 15 cm from the anal verge by flexible sigmoidoscopy.   NARRATIVE: Informed consent was verified. The patient was taken to the operating room, placed supine on the operating table and SCD's were applied. General endotracheal anesthesia was induced without difficulty.  He was then positioned in the lithotomy position with Allen stirrups.  Pressure points were evaluated and padded.  A foley catheter was then placed by nursing under sterile conditions. Hair on the abdomen was clipped.  He was secured to the operating table. The abdomen was then prepped and draped in the standard sterile fashion. Surgical timeout was called indicating the correct patient, procedure, positioning and need for preoperative antibiotics.   An OG tube was placed by anesthesia and confirmed to be to suction.  At Palmer's point, a stab incision  was created and the Veress needle was introduced into the peritoneal cavity on the first attempt.  Intraperitoneal location was confirmed by the aspiration and saline drop test.  Pneumoperitoneum was established to a maximum pressure of 15 mmHg using CO2.  Following this, the abdomen was marked for planned trocar sites.  Just to the right and cephalad to the umbilicus, an 8 mm incision was created and an 8 mm blunt tipped robotic trocar was cautiously placed into the peritoneal cavity.  The laparoscope was inserted and demonstrated no evidence of trocar site nor Veress needle site complications.  The Veress needle was removed.  Bilateral transversus abdominis plane blocks were then created using a dilute mixture of Exparel with Marcaine.  3 additional 8 mm robotic trochars were placed under direct visualization roughly in a line extending from the right ASIS towards the left upper quadrant. The bladder was inspected and noted to be at/below the pubic symphysis.  Staying 3 fingerbreadths above the pubic symphysis, an incision was created and the 12 mm robotic trocar inserted directed cephalad into the peritoneal cavity under direct visualization.  An additional 5 mm assist port was placed in the right lateral abdomen under direct visualization.  The abdomen was surveyed and there was omental containing adhesions across the midline.  He was positioned in Trendelenburg with the left side tilted slightly up.  Small bowel was carefully retracted out of the pelvis.  The robot was then docked and I went to the console.  We began with adhesiolysis. Adhesions consisting of primarily omentum were taken down from the midline abdomen.  Additional omental contain adhesions were identified on the colon going up to the colostomy.  All these were lysed sharply.  Total time of adhesiolysis is approximately 30 minutes.  The omentum was inspected and noted to be hemostatic.   We began by identifying the rectal stump in the pelvis.   There are no significant adhesions overlying it.  Were actually able to see a healthy appearing staple line that is intact.  There are well-formed staples at this location.  The rectal stump is elevated anteriorly began by mobilizing the stump partially by gaining access to the posterior plane first-the plane between the presacral fascia and the knee is appropriate the rectum is developed posteriorly followed by laterally.  There is a remnant superior hemorrhoidal vessel that is identified.  In order to facilitate mobility of the rectal stump, we determined it would need to be ligated and divided.  First however, we began by identifying both the left and right ureters.  These are in their normal anatomic positions along the left pelvic sidewall.  After confirming there well away from the hemorrhoidal pedicle, the pedicle was circumferentially dissected.  This is then ligated and divided with the vessel sealer.  The stump was inspected and noted to be hemostatic.  At this point, we had sufficient mobility of the rectal stump with a healthy appearing staple line.  Attention is then directed to mobilization of the descending colon.  The colostomy is identified in the left hemiabdomen.  Omental adhesions were freed from the colon.  The descending colon was fully mobilized by incising the Special Ranes line of Toldt all the way up to the splenic flexure.  The associated mesocolon was reflected medially.  The robot was then undocked and I went to scrubbed back in.  The colostomy was circumferentially incised.  This is mobilized from the subcutaneous tissue and then the fascia of the rectus muscle.  At this point, the colon easily reaches well out of the wound.  A wound protector was placed.  The proximal point of division on the colostomy is identified.  The mesentery is cleared at this level and ligated using 2-0 silk suture.  A pursestring clamp was then applied.  2-0 Prolene on a Keith needle was passed.  The colostomy  was divided and passed off.  Belt loops consisting of 3-0 silk were placed on the pursestring suture line.  EEA sizers were used and a 29 mm EEA was selected.  The anvil was placed and the pursestring is tied.  A small amount of fat was cleared from the planned anastomosis and there were no diverticula apparent at this location either.  This was then placed back into the abdomen.  A cap was placed over the wound protector.  Pneumoperitoneum was reestablished.  We then assessed the region of the pelvis and it was apparent that we would need to mobilize the splenic flexure in order to facilitate the interval reaching into the pelvis without any tension.  I went back to the robot.  We began by mobilizing the omentum off of the distal transverse colon.  The lesser sac is readily gained.  Were able to then mobilized the associated mesocolon medially and inferiorly.  This was then elevated to the level of the inferior border the pancreas.  Working out more laterally, the associated mesocolon was reflected taking down the splenic flexure in its entirety.  In doing this, the interval reaches into the deep pelvis without any tension and remains in that location.    Attention was turned to performing a perfusion test. ICG was administered by anesthesia and at the level of the anvil, there was excellent uptake of the tracer.  The  rectum was also well perfused in appearance.  The robot was then undocked.   I then went below to pass the stapler.  My partner remained above.  EEA sizers were cautiously introduced via the anus and advanced under direct visualization.  The stapler was passed and the spike deployed just anterior to the staple line.  The components were then mated.  Orientation was confirmed such that there is no twisting of the colon nor small bowel underneath the mesenteric defect. Care was taken to ensure no other structures were incorporated within this either.  The stapler was then closed, held, and fired.  This was then removed. The donuts were inspected and noted to be complete.  The colon proximal to the anastomosis was then gently occluded. The pelvis was filled with sterile irrigation. Under direct visualization, I passed a flexible sigmoidoscope.  The anastomosis was under water.  With good distention of the anastomosis there was no air leak. The anastomosis pink in appearance.  This is located at 15 cm from the anal verge by flexible sigmoidoscopy.  It is hemostatic.  Additionally, looking from above, there is no tension on the colon or mesentery.  Sigmoidoscope was withdrawn.  Irrigation was evacuated from the pelvis.  The abdomen and pelvis are surveyed and noted to be completely hemostatic without any apparent injury.  Under direct visualization, all trochars are removed.  The Alexis wound protector was removed.    The rectus fascia was then closed using 2 running #1 PDS sutures at the former colostomy site.  The fascia was then palpated and noted to be completely closed.  Additional anesthetic was infiltrated at the former colostomy site.  Sponge, needle, and instrument counts were reported correct x2. 4-0 Monocryl subcuticular suture was used to close the skin of all incision sites.  Dermabond was placed over all incisions.  A moist 4 x 4 was wicked in the former colostomy site, covered with additional 4 x 4s and then an ABD pad secured with tape.   He was then taken out of lithotomy, awakened from anesthesia, extubated, and transferred to a stretcher for transport to PACU in satisfactory condition having tolerated the procedure well.

## 2022-10-01 NOTE — Progress Notes (Signed)
Patient's temperature went from 98.0 for first post-op to 100.1 for third post-op vitals. Paged Dr. Carolynne Edouard who returned the call. Patient is on scheduled Tylenol. No changes to be made.

## 2022-10-01 NOTE — Progress Notes (Addendum)
Spoke with interpreting services to request Arabic interpreter for patient today for pre-op and PACU care.   9:32 AM Interpreter services returned call to let us know they would be here in 15 minutes

## 2022-10-01 NOTE — Anesthesia Postprocedure Evaluation (Signed)
Anesthesia Post Note  Patient: Todd Jordan  Procedure(s) Performed: ROBOTIC COLOSTOMY TAKEDOWN, BILATERAL TAP BLOCK, TISSUE PERFUSSION ASSESMENT VIA FIREFLY INJECTION, MOBILIZATION OF SPLENIC FLEXURE FLEXIBLE SIGMOIDOSCOPY LYSIS OF ADHESION     Patient location during evaluation: PACU Anesthesia Type: General Level of consciousness: awake and alert Pain management: pain level controlled Vital Signs Assessment: post-procedure vital signs reviewed and stable Respiratory status: spontaneous breathing, nonlabored ventilation and respiratory function stable Cardiovascular status: blood pressure returned to baseline Postop Assessment: no apparent nausea or vomiting Anesthetic complications: no   No notable events documented.  Last Vitals:  Vitals:   10/01/22 1445 10/01/22 1505  BP: (!) 148/87 133/80  Pulse: (!) 102 (!) 103  Resp: 15 16  Temp:  36.7 C  SpO2: 96% 99%    Last Pain:  Vitals:   10/01/22 1505  TempSrc:   PainSc: 7                  Shanda Howells

## 2022-10-01 NOTE — Transfer of Care (Signed)
Immediate Anesthesia Transfer of Care Note  Patient: Todd Jordan  Procedure(s) Performed: ROBOTIC COLOSTOMY TAKEDOWN, BILATERAL TAP BLOCK, TISSUE PERFUSSION ASSESMENT VIA FIREFLY INJECTION, MOBILIZATION OF SPLENIC FLEXURE FLEXIBLE SIGMOIDOSCOPY LYSIS OF ADHESION  Patient Location: PACU  Anesthesia Type:General  Level of Consciousness: awake, alert , and oriented  Airway & Oxygen Therapy: Patient Spontanous Breathing and Patient connected to face mask oxygen  Post-op Assessment: Report given to RN, Post -op Vital signs reviewed and stable, and Patient moving all extremities X 4  Post vital signs: Reviewed and stable  Last Vitals:  Vitals Value Taken Time  BP 138/95 10/01/22 1340  Temp    Pulse 112 10/01/22 1341  Resp 11 10/01/22 1341  SpO2 100 % 10/01/22 1341  Vitals shown include unvalidated device data.  Last Pain:  Vitals:   10/01/22 0958  TempSrc:   PainSc: 0-No pain         Complications: No notable events documented.

## 2022-10-02 ENCOUNTER — Encounter (HOSPITAL_COMMUNITY): Payer: Self-pay | Admitting: Surgery

## 2022-10-02 LAB — BASIC METABOLIC PANEL
Anion gap: 7 (ref 5–15)
BUN: 11 mg/dL (ref 6–20)
CO2: 25 mmol/L (ref 22–32)
Calcium: 8.7 mg/dL — ABNORMAL LOW (ref 8.9–10.3)
Chloride: 103 mmol/L (ref 98–111)
Creatinine, Ser: 1.02 mg/dL (ref 0.61–1.24)
GFR, Estimated: >60 mL/min (ref >60)
Glucose, Bld: 111 mg/dL — ABNORMAL HIGH (ref 70–99)
Potassium: 3.5 mmol/L (ref 3.5–5.1)
Sodium: 135 mmol/L (ref 135–145)

## 2022-10-02 LAB — CBC
HCT: 38.3 % — ABNORMAL LOW (ref 39.0–52.0)
Hemoglobin: 13.2 g/dL (ref 13.0–17.0)
MCH: 30.1 pg (ref 26.0–34.0)
MCHC: 34.5 g/dL (ref 30.0–36.0)
MCV: 87.4 fL (ref 80.0–100.0)
Platelets: 181 10*3/uL (ref 150–400)
RBC: 4.38 MIL/uL (ref 4.22–5.81)
RDW: 14.4 % (ref 11.5–15.5)
WBC: 13.7 10*3/uL — ABNORMAL HIGH (ref 4.0–10.5)
nRBC: 0 % (ref 0.0–0.2)

## 2022-10-02 LAB — SURGICAL PATHOLOGY

## 2022-10-02 NOTE — Progress Notes (Signed)
  Subjective No acute events. Feeling well. No n/v. Pain well controlled. No flatus/BM yet.  Objective: Vital signs in last 24 hours: Temp:  [97.7 F (36.5 C)-100.1 F (37.8 C)] 98.6 F (37 C) (05/17 0436) Pulse Rate:  [77-113] 77 (05/17 0436) Resp:  [14-19] 18 (05/17 0436) BP: (121-148)/(69-98) 123/80 (05/17 0436) SpO2:  [94 %-100 %] 97 % (05/17 0436) Weight:  [73 kg] 73 kg (05/17 0500) Last BM Date : 10/01/22  Intake/Output from previous day: 05/16 0701 - 05/17 0700 In: 2576.1 [P.O.:240; I.V.:2186.1; IV Piggyback:150] Out: 1100 [Urine:1050; Blood:50] Intake/Output this shift: No intake/output data recorded.  Gen: NAD, comfortable CV: RRR Pulm: Normal work of breathing Abd: soft, NT/ND; ostomy site packing removed. Ext: SCDs in place  Lab Results: CBC  Recent Labs    10/02/22 0447  WBC 13.7*  HGB 13.2  HCT 38.3*  PLT 181   BMET Recent Labs    10/02/22 0447  NA 135  K 3.5  CL 103  CO2 25  GLUCOSE 111*  BUN 11  CREATININE 1.02  CALCIUM 8.7*   PT/INR No results for input(s): "LABPROT", "INR" in the last 72 hours. ABG No results for input(s): "PHART", "HCO3" in the last 72 hours.  Invalid input(s): "PCO2", "PO2"  Studies/Results:  Anti-infectives: Anti-infectives (From admission, onward)    Start     Dose/Rate Route Frequency Ordered Stop   10/01/22 1400  neomycin (MYCIFRADIN) tablet 1,000 mg  Status:  Discontinued       See Hyperspace for full Linked Orders Report.   1,000 mg Oral 3 times per day 10/01/22 0924 10/01/22 1454   10/01/22 1400  metroNIDAZOLE (FLAGYL) tablet 1,000 mg  Status:  Discontinued       See Hyperspace for full Linked Orders Report.   1,000 mg Oral 3 times per day 10/01/22 0924 10/01/22 1454   10/01/22 0930  cefoTEtan (CEFOTAN) 2 g in sodium chloride 0.9 % 100 mL IVPB        2 g 200 mL/hr over 30 Minutes Intravenous On call to O.R. 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

## 2022-10-02 NOTE — TOC CM/SW Note (Signed)
  Transition of Care (TOC) Screening Note   Patient Details  Name: Todd Jordan Date of Birth: 1995-10-12   Transition of Care [facility]) CM/SW Contact:    Amada Jupiter, LCSW Phone Number: 10/02/2022, 2:18 PM    Transition of Care Department [facility]) has reviewed patient and no TOC needs have been identified at this time. We will continue to monitor patient advancement through interdisciplinary progression rounds. If new patient transition needs arise, please place a TOC consult.

## 2022-10-03 LAB — CBC
HCT: 41.1 % (ref 39.0–52.0)
Hemoglobin: 13.9 g/dL (ref 13.0–17.0)
MCH: 30 pg (ref 26.0–34.0)
MCHC: 33.8 g/dL (ref 30.0–36.0)
MCV: 88.6 fL (ref 80.0–100.0)
Platelets: 180 10*3/uL (ref 150–400)
RBC: 4.64 MIL/uL (ref 4.22–5.81)
RDW: 14.4 % (ref 11.5–15.5)
WBC: 11.2 10*3/uL — ABNORMAL HIGH (ref 4.0–10.5)
nRBC: 0 % (ref 0.0–0.2)

## 2022-10-03 LAB — BASIC METABOLIC PANEL
Anion gap: 8 (ref 5–15)
BUN: 11 mg/dL (ref 6–20)
CO2: 26 mmol/L (ref 22–32)
Calcium: 8.8 mg/dL — ABNORMAL LOW (ref 8.9–10.3)
Chloride: 101 mmol/L (ref 98–111)
Creatinine, Ser: 1.07 mg/dL (ref 0.61–1.24)
GFR, Estimated: >60 mL/min (ref >60)
Glucose, Bld: 111 mg/dL — ABNORMAL HIGH (ref 70–99)
Potassium: 3.4 mmol/L — ABNORMAL LOW (ref 3.5–5.1)
Sodium: 135 mmol/L (ref 135–145)

## 2022-10-03 MED ORDER — KCL-LACTATED RINGERS-D5W 20 MEQ/L IV SOLN
INTRAVENOUS | Status: DC
  Filled 2022-10-03 (×5): qty 1000

## 2022-10-03 MED ORDER — METHOCARBAMOL 500 MG PO TABS: 500.0000 mg | ORAL_TABLET | Freq: Four times a day (QID) | ORAL | Status: DC | PRN

## 2022-10-03 MED ORDER — HYDROMORPHONE HCL 1 MG/ML IJ SOLN: 0.0000 mg | INTRAMUSCULAR | Status: DC | PRN

## 2022-10-03 NOTE — Progress Notes (Signed)
2 Days Post-Op   Subjective/Chief Complaint: Having some nausea and dizziness.  Has had some flatus and small BM. Reports pain is back within 1 hour of pain meds.    Interviewed with video arabic interpreter.     Objective: Vital signs in last 24 hours: Temp:  [98.3 F (36.8 C)-99.1 F (37.3 C)] 98.8 F (37.1 C) (05/18 0757) Pulse Rate:  [71-88] 87 (05/18 0757) Resp:  [16-18] 18 (05/18 0757) BP: (125-145)/(70-89) 131/89 (05/18 0757) SpO2:  [100 %] 100 % (05/18 0757) Weight:  [73 kg] 73 kg (05/18 0500) Last BM Date : (P) 10/01/22  Intake/Output from previous day: 05/17 0701 - 05/18 0700 In: 407.2 [P.O.:240; I.V.:167.2] Out: 1400 [Urine:1400] Intake/Output this shift: No intake/output data recorded.  General: alert and oriented. Looks uncomfortable.  CV RR&R Resp: breathing comfortably Abd: soft, mildly distended, approp tender.  Minimal bloody drainage on prior ostomy site bandage.   Lab Results:  Recent Labs    10/02/22 0447 10/03/22 0406  WBC 13.7* 11.2*  HGB 13.2 13.9  HCT 38.3* 41.1  PLT 181 180   BMET Recent Labs    10/02/22 0447 10/03/22 0406  NA 135 135  K 3.5 3.4*  CL 103 101  CO2 25 26  GLUCOSE 111* 111*  BUN 11 11  CREATININE 1.02 1.07  CALCIUM 8.7* 8.8*   PT/INR No results for input(s): "LABPROT", "INR" in the last 72 hours. ABG No results for input(s): "PHART", "HCO3" in the last 72 hours.  Invalid input(s): "PCO2", "PO2"  Studies/Results: No results found.  Anti-infectives: Anti-infectives (From admission, onward)    Start     Dose/Rate Route Frequency Ordered Stop   10/01/22 1400  neomycin (MYCIFRADIN) tablet 1,000 mg  Status:  Discontinued       See Hyperspace for full Linked Orders Report.   1,000 mg Oral 3 times per day 10/01/22 0924 10/01/22 1454   10/01/22 1400  metroNIDAZOLE (FLAGYL) tablet 1,000 mg  Status:  Discontinued       See Hyperspace for full Linked Orders Report.   1,000 mg Oral 3 times per day 10/01/22 0924  10/01/22 1454   10/01/22 0930  cefoTEtan (CEFOTAN) 2 g in sodium chloride 0.9 % 100 mL IVPB        2 g 200 mL/hr over 30 Minutes Intravenous On call to O.R. 10/01/22 0924 10/01/22 1120       Assessment/Plan: s/p Procedure(s): ROBOTIC COLOSTOMY TAKEDOWN, BILATERAL TAP BLOCK, TISSUE PERFUSSION ASSESMENT VIA FIREFLY INJECTION, MOBILIZATION OF SPLENIC FLEXURE (N/A) FLEXIBLE SIGMOIDOSCOPY (N/A) LYSIS OF ADHESION (N/A) 5/17 White Restart IV fluids for dizziness. Add robaxin. Increase frequency of dilaudid Diet/nausea meds as tolerated. Ambulate.    LOS: 2 days    Almond Lint 10/03/2022

## 2022-10-03 NOTE — Progress Notes (Signed)
Pharmacy Brief Note - Alvimopan (Entereg)  The standing order set for alvimopan (Entereg) now includes an automatic order to discontinue the drug after the patient has had a bowel movement. The change was approved by the Pharmacy & Therapeutics Committee and the Medical Executive Committee.   Per pt's RN Mittie Bodo) via [facility] msg and also noted in Dr. Arita Miss note, this patient has had bowel movements.  Therefore, alvimopan has been discontinued. If there are questions, please contact the pharmacy at [phone].   Thank you-  Dorna Leitz, PharmD, BCPS 10/03/2022 10:05 AM

## 2022-10-04 LAB — BASIC METABOLIC PANEL
Anion gap: 7 (ref 5–15)
BUN: 10 mg/dL (ref 6–20)
CO2: 26 mmol/L (ref 22–32)
Calcium: 8.6 mg/dL — ABNORMAL LOW (ref 8.9–10.3)
Chloride: 103 mmol/L (ref 98–111)
Creatinine, Ser: 1.05 mg/dL (ref 0.61–1.24)
GFR, Estimated: >60 mL/min (ref >60)
Glucose, Bld: 105 mg/dL — ABNORMAL HIGH (ref 70–99)
Potassium: 3.7 mmol/L (ref 3.5–5.1)
Sodium: 136 mmol/L (ref 135–145)

## 2022-10-04 LAB — CBC
HCT: 38.4 % — ABNORMAL LOW (ref 39.0–52.0)
Hemoglobin: 12.7 g/dL — ABNORMAL LOW (ref 13.0–17.0)
MCH: 29.5 pg (ref 26.0–34.0)
MCHC: 33.1 g/dL (ref 30.0–36.0)
MCV: 89.3 fL (ref 80.0–100.0)
Platelets: 175 10*3/uL (ref 150–400)
RBC: 4.3 MIL/uL (ref 4.22–5.81)
RDW: 14.5 % (ref 11.5–15.5)
WBC: 6.4 10*3/uL (ref 4.0–10.5)
nRBC: 0 % (ref 0.0–0.2)

## 2022-10-04 NOTE — Progress Notes (Signed)
3 Days Post-Op   Subjective/Chief Complaint: Pt with some dizziness yesterday with amb Some flatus but no BM Tol PO   Objective: Vital signs in last 24 hours: Temp:  [98.5 F (36.9 C)-98.6 F (37 C)] 98.5 F (36.9 C) (05/19 0521) Pulse Rate:  [65-73] 70 (05/19 0521) Resp:  [18] 18 (05/19 0521) BP: (119-134)/(76-96) 134/96 (05/19 0521) SpO2:  [99 %-100 %] 100 % (05/19 0521) Weight:  [69.5 kg] 69.5 kg (05/19 0500) Last BM Date : 10/04/22  Intake/Output from previous day: 05/18 0701 - 05/19 0700 In: 2211.3 [P.O.:600; I.V.:1561.3; IV Piggyback:50] Out: -  Intake/Output this shift: No intake/output data recorded.  General appearance: alert and cooperative GI: soft, non-tender; bowel sounds normal; no masses,  no organomegaly and inc c/d/i  Lab Results:  Recent Labs    10/03/22 0406 10/04/22 0418  WBC 11.2* 6.4  HGB 13.9 12.7*  HCT 41.1 38.4*  PLT 180 175   BMET Recent Labs    10/03/22 0406 10/04/22 0418  NA 135 136  K 3.4* 3.7  CL 101 103  CO2 26 26  GLUCOSE 111* 105*  BUN 11 10  CREATININE 1.07 1.05  CALCIUM 8.8* 8.6*   PT/INR No results for input(s): "LABPROT", "INR" in the last 72 hours. ABG No results for input(s): "PHART", "HCO3" in the last 72 hours.  Invalid input(s): "PCO2", "PO2"  Studies/Results: No results found.  Anti-infectives: Anti-infectives (From admission, onward)    Start     Dose/Rate Route Frequency Ordered Stop   10/01/22 1400  neomycin (MYCIFRADIN) tablet 1,000 mg  Status:  Discontinued       See Hyperspace for full Linked Orders Report.   1,000 mg Oral 3 times per day 10/01/22 0924 10/01/22 1454   10/01/22 1400  metroNIDAZOLE (FLAGYL) tablet 1,000 mg  Status:  Discontinued       See Hyperspace for full Linked Orders Report.   1,000 mg Oral 3 times per day 10/01/22 0924 10/01/22 1454   10/01/22 0930  cefoTEtan (CEFOTAN) 2 g in sodium chloride 0.9 % 100 mL IVPB        2 g 200 mL/hr over 30 Minutes Intravenous On call to  O.R. 10/01/22 0924 10/01/22 1120       Assessment/Plan: s/p Procedure(s): ROBOTIC COLOSTOMY TAKEDOWN, BILATERAL TAP BLOCK, TISSUE PERFUSSION ASSESMENT VIA FIREFLY INJECTION, MOBILIZATION OF SPLENIC FLEXURE (N/A) FLEXIBLE SIGMOIDOSCOPY (N/A) LYSIS OF ADHESION (N/A) POD 3 -cont' await ROBF, con't PO -mobilize -hopefully home in next 1-2d  LOS: 3 days    Axel Filler 10/04/2022

## 2022-10-05 ENCOUNTER — Other Ambulatory Visit (HOSPITAL_COMMUNITY): Payer: Self-pay

## 2022-10-05 MED ORDER — TRAMADOL HCL 50 MG PO TABS
50.0000 mg | ORAL_TABLET | Freq: Four times a day (QID) | ORAL | 0 refills | Status: AC | PRN
  Filled 2022-10-05: qty 15, 4d supply, fill #0

## 2022-10-05 NOTE — Progress Notes (Signed)
Discharge education completed. Meds to bed were delivered. IV's removed.

## 2022-10-05 NOTE — Discharge Instructions (Signed)
POST OP INSTRUCTIONS AFTER COLON SURGERY  DIET: Be sure to include lots of fluids daily to stay hydrated - 64oz of water per day (8, 8 oz glasses).  Avoid fast food or heavy meals for the first couple of weeks as your are more likely to get nauseated. Avoid raw/uncooked fruits or vegetables for the first 4 weeks (its ok to have these if they are blended into smoothie form). If you have fruits/vegetables, make sure they are cooked until soft enough to mash on the roof of your mouth and chew your food well. Otherwise, diet as tolerated.  Take your usually prescribed home medications unless otherwise directed.  PAIN CONTROL: Pain is best controlled by a usual combination of three different methods TOGETHER: Ice/Heat Over the counter pain medication Prescription pain medication Most patients will experience some swelling and bruising around the surgical site.  Ice packs or heating pads (30-60 minutes up to 6 times a day) will help. Some people prefer to use ice alone, heat alone, alternating between ice & heat.  Experiment to what works for you.  Swelling and bruising can take several weeks to resolve.   It is helpful to take an over-the-counter pain medication regularly for the first few weeks: Ibuprofen (Motrin/Advil) - 200mg  tabs - take 3 tabs (600mg ) every 6 hours as needed for pain (unless you have been directed previously to avoid NSAIDs/ibuprofen) Acetaminophen (Tylenol) - you may take 650mg  every 6 hours as needed. You can take this with motrin as they act differently on the body. If you are taking a narcotic pain medication that has acetaminophen in it, do not take over the counter tylenol at the same time. NOTE: You may take both of these medications together - most patients  find it most helpful when alternating between the two (i.e. Ibuprofen at 6am, tylenol at 9am, ibuprofen at 12pm ..Marland Kitchen) A  prescription for pain medication should be given to you upon discharge.  Take your pain medication as  prescribed if your pain is not adequatly controlled with the over-the-counter pain reliefs mentioned above.  Avoid getting constipated.  Between the surgery and the pain medications, it is common to experience some constipation.  Increasing fluid intake and taking a fiber supplement (such as Metamucil, Citrucel, FiberCon, MiraLax, etc) 1-2 times a day regularly will usually help prevent this problem from occurring.  A mild laxative (prune juice, Milk of Magnesia, MiraLax, etc) should be taken according to package directions if there are no bowel movements after 48 hours.    Dressing: Your incisions are covered in Dermabond which is like sterile superglue for the skin. This will come off on it's own in a couple weeks. It is waterproof and you may bathe normally starting the day after your surgery in a shower. Avoid baths/pools/lakes/oceans until your wounds have fully healed. Your prior ostomy site may be covered with dry gauze and changed daily. It is ok to shower normally and get this wound wet in the shower with soap/water over the wound - this will actually help keep it clean.  ACTIVITIES as tolerated:   Avoid heavy lifting (>10lbs or 1 gallon of milk) for the next 6 weeks. You may resume regular daily activities as tolerated--such as daily self-care, walking, climbing stairs--gradually increasing activities as tolerated.  If you can walk 30 minutes without difficulty, it is safe to try more intense activity such as jogging, treadmill, bicycling, low-impact aerobics.  DO NOT PUSH THROUGH PAIN.  Let pain be your guide: If it  hurts to do something, don't do it. You may drive when you are no longer taking prescription pain medication, you can comfortably wear a seatbelt, and you can safely maneuver your car and apply brakes.  FOLLOW UP in our office Please call [facility] at [phone] to set up an appointment to see your surgeon in the office for a follow-up appointment approximately 2 weeks after your  surgery. Make sure that you call for this appointment the day you arrive home to insure a convenient appointment time.  9. If you have disability or family leave forms that need to be completed, you may have them completed by your primary care physician's office; for return to work instructions, please ask our office staff and they will be happy to assist you in obtaining this documentation   When to call us [phone]: Poor pain control Reactions / problems with new medications (rash/itching, etc)  Fever over 101.5 F (38.5 C) Inability to urinate Nausea/vomiting Worsening swelling or bruising Continued bleeding from incision. Increased pain, redness, or drainage from the incision  The clinic staff is available to answer your questions during regular business hours (8:30am-5pm).  Please don't hesitate to call and ask to speak to one of our nurses for clinical concerns.   A surgeon from [facility] is always on call at the hospitals   If you have a medical emergency, go to the nearest emergency room or call 911.  [facility] [address], [place], [place]  [address] MAIN: [phone] FAX: [phone] [email or web address]

## 2022-10-05 NOTE — Discharge Summary (Signed)
Patient ID: Todd Jordan MRN: [id number] DOB/AGE: March 21, 1996 26 y.o.  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 His diet gradually advanced which he tolerated. He began having spontaneous bowel function. On 10/05/22 he is comfortable with and stable for discharge home. Follow-up in our office has been arranged    Allergies as of 10/05/2022   No Known Allergies      Medication List     STOP taking these medications    acetaminophen 500 MG tablet Commonly known as: TYLENOL   diphenhydrAMINE 25 MG tablet Commonly known as: BENADRYL   ibuprofen 200 MG tablet Commonly known as: ADVIL   methocarbamol 500 MG tablet Commonly known as: ROBAXIN   oxyCODONE 5 MG immediate release tablet Commonly known as: Oxy IR/ROXICODONE       TAKE these medications    traMADol 50 MG tablet Commonly known as: Ultram Take 1 tablet (50 mg total) by mouth every 6 (six) hours as needed for up to 5 days (postop pain not controlled with tylenol and ibuprofen first).          Follow-up Information     Andria Meuse, MD Follow up on 10/28/2022.   Specialties: General Surgery, Colon and Rectal Surgery Why: Please arrive by 8:45 am Contact information: [address] [place] [address] [phone]                 Stephanie Coup. Cliffton Asters, M.D. [facility]

## 2022-10-06 ENCOUNTER — Telehealth: Payer: Self-pay

## 2022-10-06 NOTE — Transitions of Care (Post Inpatient/ED Visit) (Unsigned)
   10/06/2022  Name: Todd Jordan MRN: [id number] DOB: [birth year]  Today's TOC FU Call Status: Today's TOC FU Call Status:: Successful TOC FU Call Competed TOC FU Call Complete Date: 10/06/22  Transition Care Management Follow-up Telephone Call Date of Discharge: 10/05/22 Discharge Facility: [facility]) Type of Discharge: Inpatient Admission Primary Inpatient Discharge Diagnosis:: colostomy takedown How have you been since you were released from the hospital?: Better Any questions or concerns?: Yes Patient Questions/Concerns:: needs meds for nausea Patient Questions/Concerns Addressed: Notified Provider of Patient Questions/Concerns  Items Reviewed: Did you receive and understand the discharge instructions provided?: Yes Medications obtained,verified, and reconciled?: Yes (Medications Reviewed) Any new allergies since your discharge?: No Dietary orders reviewed?: Yes Do you have support at home?: Yes People in Home: sibling(s)  Medications Reviewed Today: Medications Reviewed Today     Reviewed by Karena Addison, LPN (Licensed Practical Nurse) on 10/06/22 at [phone]  Med List Status: <None>   Medication Order Taking? Sig Documenting Provider Last Dose Status Informant  traMADol (ULTRAM) 50 MG tablet [id number] Yes Take 1 tablet (50 mg total) by mouth every 6 (six) hours as needed for up to 5 days (postop pain not controlled with tylenol and ibuprofen first). Andria Meuse, MD Taking Active             Home Care and Equipment/Supplies: Were Home Health Services Ordered?: NA Any new equipment or medical supplies ordered?: NA  Functional Questionnaire: Do you need assistance with bathing/showering or dressing?: No Do you need assistance with meal preparation?: No Do you need assistance with eating?: No Do you have difficulty maintaining continence: No Do you need assistance with getting out of bed/getting out of a chair/moving?: No Do you have difficulty  managing or taking your medications?: No  Follow up appointments reviewed: PCP Follow-up appointment confirmed?: No (no avail appt times, sent message to staff to schedule) MD Provider Line Number:[phone] Given: No Specialist Hospital Follow-up appointment confirmed?: Yes Date of Specialist follow-up appointment?: 10/28/22 Follow-Up Specialty Provider:: Dr Cliffton Asters gen surgeon Do you need transportation to your follow-up appointment?: No Do you understand care options if your condition(s) worsen?: Yes-patient verbalized understanding    SIGNATURE Karena Addison, LPN [facility] Nurse Health Advisor Direct Dial [phone]

## 2022-10-27 IMAGING — RF DG BE SINGLE CONTRAST
1 series · 15 of 20 positions shown · non-contrast
Comparison: CT abdomen and pelvis 05/13/2020

CLINICAL DATA: S/P colostomy (HCC).  THROUGH RECTUM ONLY-PER ORDER

EXAM:
WATER SOLUBLE CONTRAST ENEMA
TECHNIQUE: Scout radiograph was performed. A rectal tube was inserted and
balloon inflated. Water-soluble contrast was then administered via
gravity. Multiple spot images were obtained. The colon was then
emptied as much as possible via gravity and a post evacuation
radiograph is obtained.
FLUOROSCOPY:
Radiation Exposure Index (as provided by the fluoroscopic device):
26.911 mGy Kerma

[Series 1: one shot · 0.14mm/px · 15 of 20 slices shown]
[im 1/20]
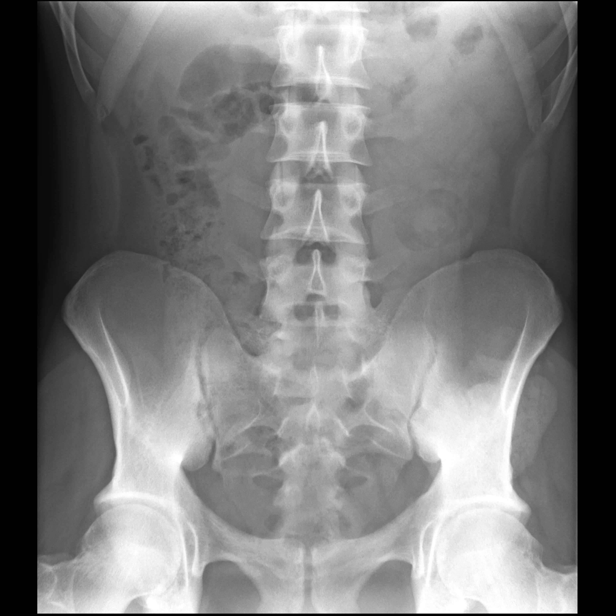
[im 3/20]
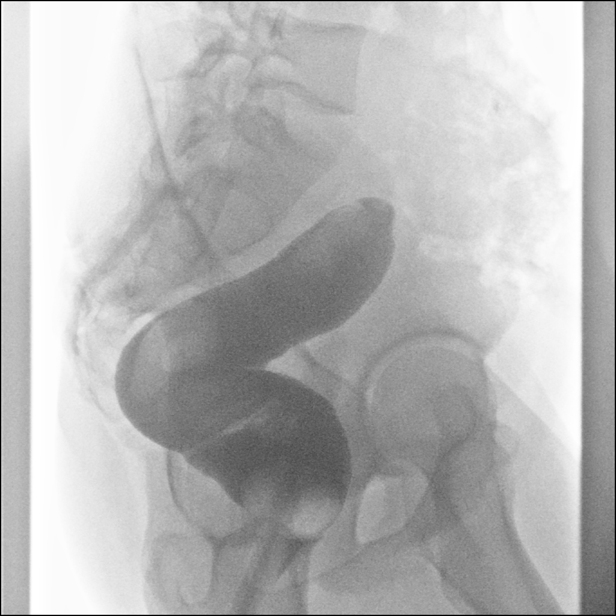
[im 4/20]
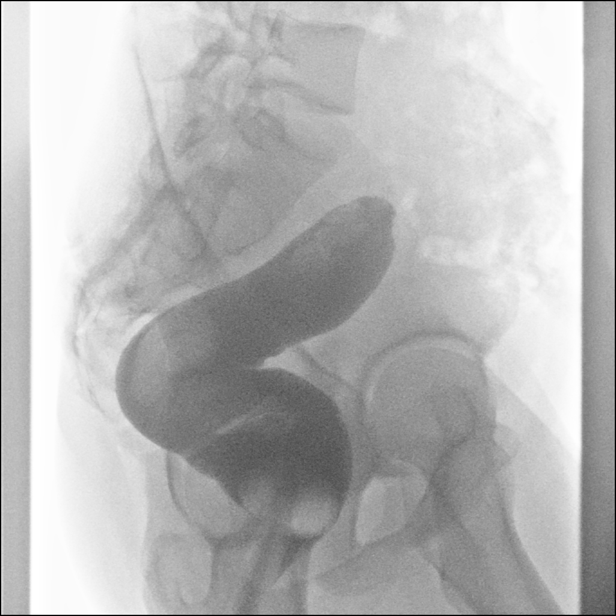
[im 5/20]
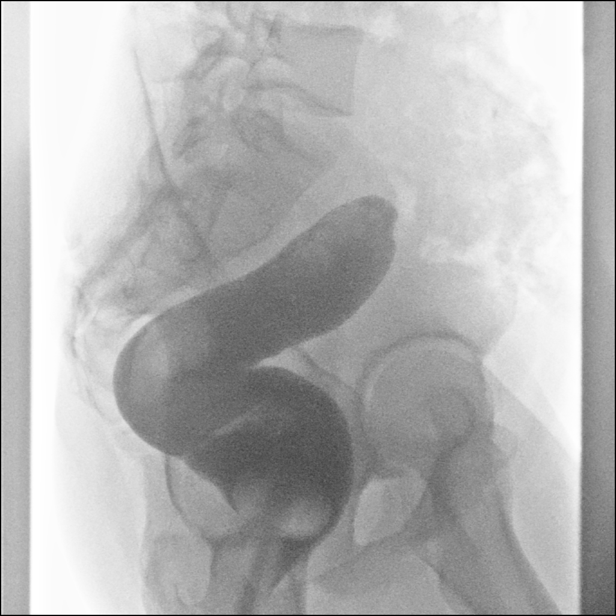
[im 7/20]
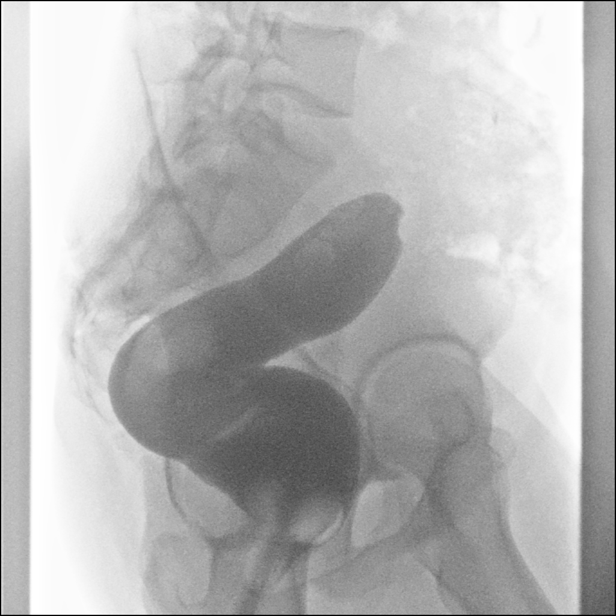
[im 8/20]
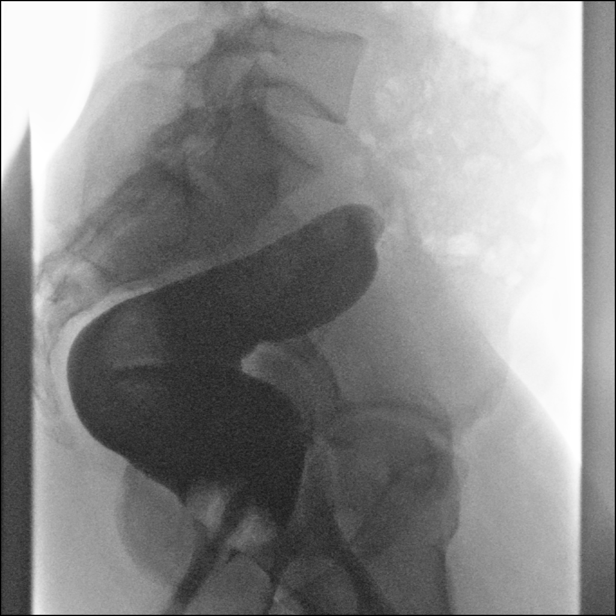
[im 9/20]
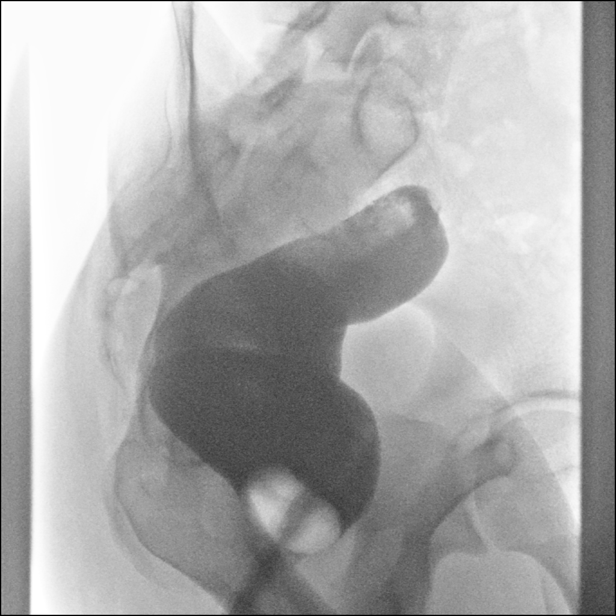
[im 11/20]
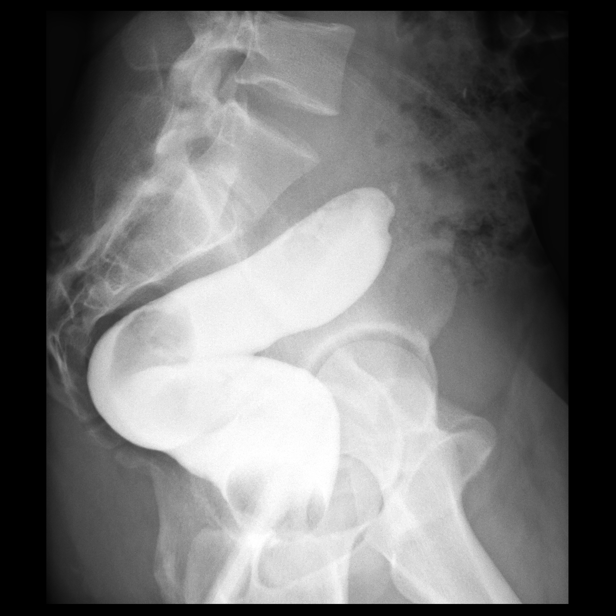
[im 12/20]
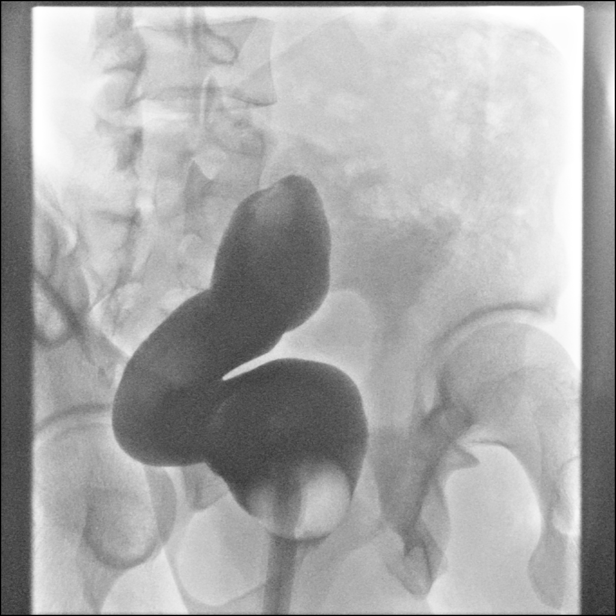
[im 13/20]
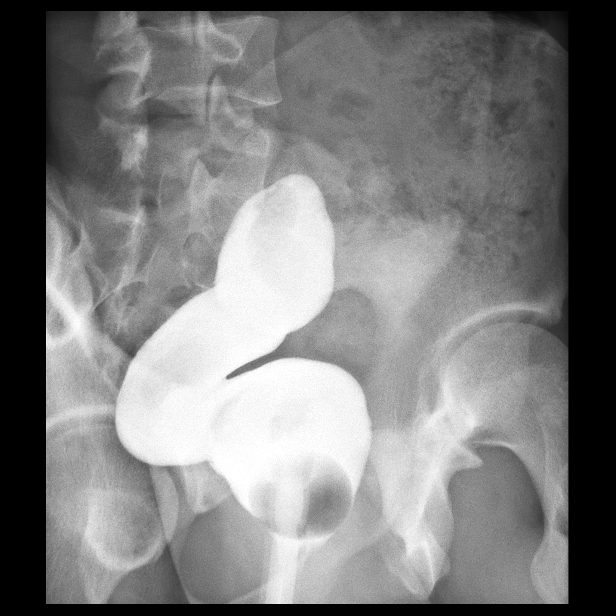
[im 15/20]
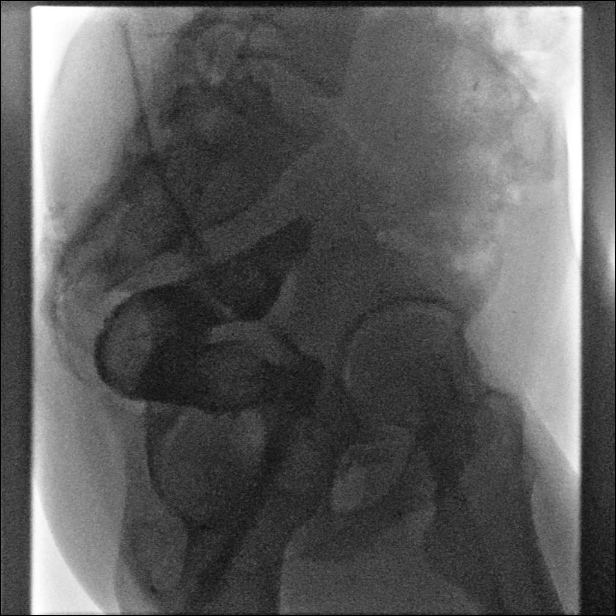
[im 16/20]
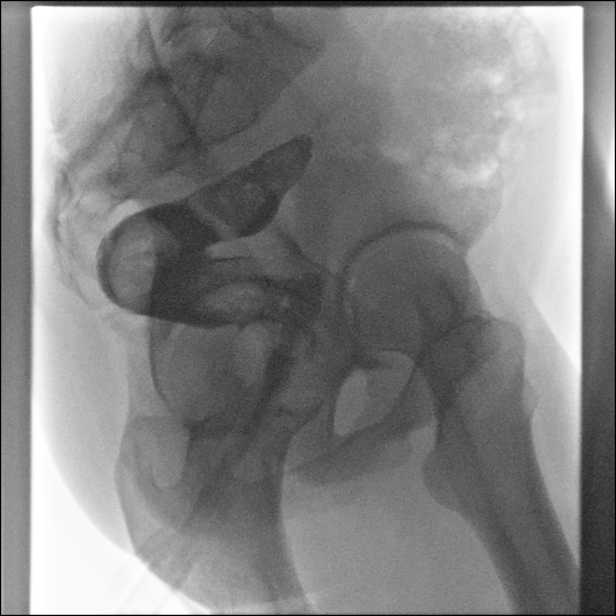
[im 17/20]
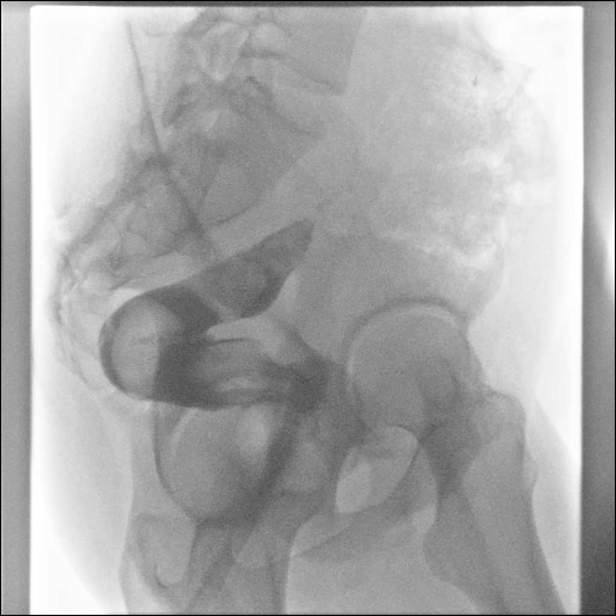
[im 19/20]
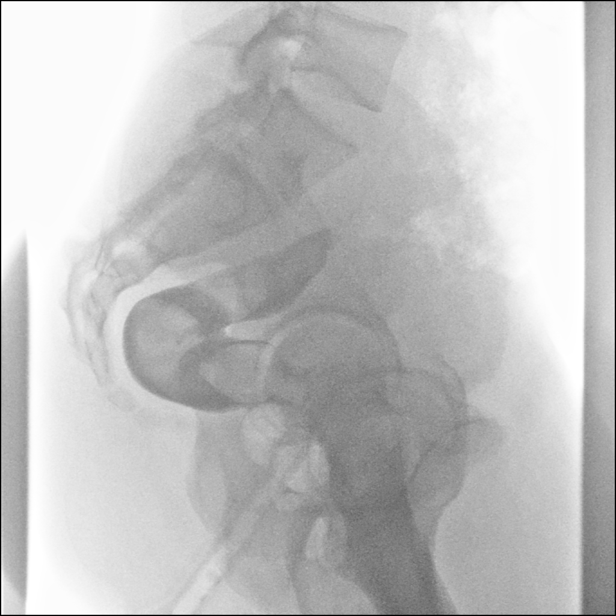
[im 20/20]
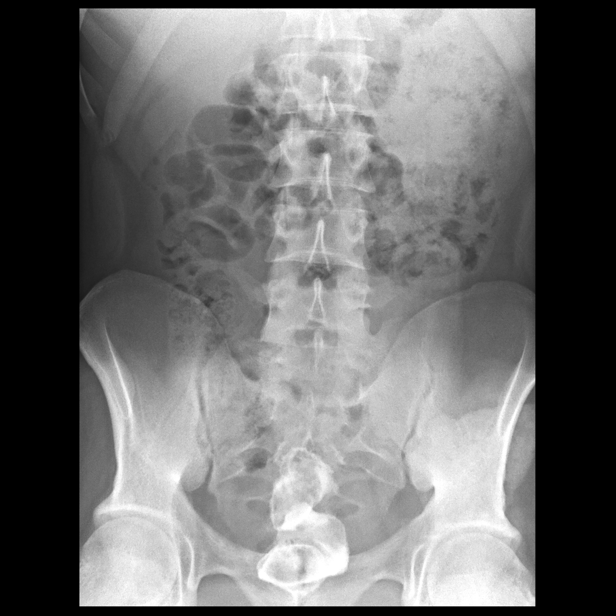

[15 of 20 positions shown; findings below may reference images not displayed]

FINDINGS: Scout radiograph demonstrates no evidence of bowel obstruction.
There is a left lower quadrant colostomy noted. A rectal tube was
inserted and balloon inflated. Water-soluble contrast was then
administered via gravity. Contrast immediately fills a blind-ending
pouch. There is no evidence of contrast leak, fistula, or other
obvious abnormality. There is a small amount of residual contrast
within the rectosigmoid pouch after evacuation.
IMPRESSION: Postsurgical changes of Hartmann's pouch. No evidence of contrast
leak.

## 2024-04-18 ENCOUNTER — Ambulatory Visit

## 2024-06-10 ENCOUNTER — Other Ambulatory Visit: Payer: Self-pay

## 2024-06-10 ENCOUNTER — Ambulatory Visit (HOSPITAL_COMMUNITY): Admission: EM | Admit: 2024-06-10 | Discharge: 2024-06-10 | Disposition: A | Discharge status: Home / Self Care

## 2024-06-10 ENCOUNTER — Encounter (HOSPITAL_COMMUNITY): Payer: Self-pay

## 2024-06-10 ENCOUNTER — Emergency Department (HOSPITAL_COMMUNITY)
Admission: EM | Admit: 2024-06-10 | Discharge: 2024-06-10 | Disposition: A | Discharge status: Home / Self Care | Attending: Emergency Medicine | Admitting: Emergency Medicine

## 2024-06-10 DIAGNOSIS — K921 Melena: Secondary | ICD-10-CM | POA: Diagnosis not present

## 2024-06-10 DIAGNOSIS — R1084 Generalized abdominal pain: Secondary | ICD-10-CM

## 2024-06-10 DIAGNOSIS — K625 Hemorrhage of anus and rectum: Secondary | ICD-10-CM | POA: Diagnosis present

## 2024-06-10 HISTORY — DX: Unspecified intestinal obstruction, unspecified as to partial versus complete obstruction: K56.609

## 2024-06-10 LAB — COMPREHENSIVE METABOLIC PANEL WITH GFR
ALT: 32 U/L (ref 0–44)
AST: 33 U/L (ref 15–41)
Albumin: 4.6 g/dL (ref 3.5–5.0)
Alkaline Phosphatase: 62 U/L (ref 38–126)
Anion gap: 11 (ref 5–15)
BUN: 10 mg/dL (ref 6–20)
CO2: 26 mmol/L (ref 22–32)
Calcium: 9.1 mg/dL (ref 8.9–10.3)
Chloride: 101 mmol/L (ref 98–111)
Creatinine, Ser: 0.94 mg/dL (ref 0.61–1.24)
GFR, Estimated: >60 mL/min
Glucose, Bld: 116 mg/dL — ABNORMAL HIGH (ref 70–99)
Potassium: 3.8 mmol/L (ref 3.5–5.1)
Sodium: 138 mmol/L (ref 135–145)
Total Bilirubin: 0.6 mg/dL (ref 0.0–1.2)
Total Protein: 7.8 g/dL (ref 6.5–8.1)

## 2024-06-10 LAB — CBC WITH DIFFERENTIAL/PLATELET
Abs Immature Granulocytes: 0.01 10*3/uL (ref 0.00–0.07)
Basophils Absolute: 0 10*3/uL (ref 0.0–0.1)
Basophils Relative: 1 %
Eosinophils Absolute: 0.1 10*3/uL (ref 0.0–0.5)
Eosinophils Relative: 1 %
HCT: 45.5 % (ref 39.0–52.0)
Hemoglobin: 15.6 g/dL (ref 13.0–17.0)
Immature Granulocytes: 0 %
Lymphocytes Relative: 45 %
Lymphs Abs: 2.4 10*3/uL (ref 0.7–4.0)
MCH: 30.4 pg (ref 26.0–34.0)
MCHC: 34.3 g/dL (ref 30.0–36.0)
MCV: 88.5 fL (ref 80.0–100.0)
Monocytes Absolute: 0.7 10*3/uL (ref 0.1–1.0)
Monocytes Relative: 14 %
Neutro Abs: 2.1 10*3/uL (ref 1.7–7.7)
Neutrophils Relative %: 39 %
Platelets: 202 10*3/uL (ref 150–400)
RBC: 5.14 MIL/uL (ref 4.22–5.81)
RDW: 14 % (ref 11.5–15.5)
WBC: 5.2 10*3/uL (ref 4.0–10.5)
nRBC: 0 % (ref 0.0–0.2)

## 2024-06-10 LAB — PROTIME-INR
INR: 1.1 (ref 0.8–1.2)
Prothrombin Time: 14.4 s (ref 11.4–15.2)

## 2024-06-10 LAB — POC OCCULT BLOOD, ED: Fecal Occult Bld: POSITIVE — AB

## 2024-06-10 LAB — TYPE AND SCREEN
ABO/RH(D): A POS
Antibody Screen: NEGATIVE

## 2024-06-10 MED ORDER — PANTOPRAZOLE SODIUM 40 MG IV SOLR
40.0000 mg | Freq: Once | INTRAVENOUS | Status: DC
  Filled 2024-06-10: qty 10

## 2024-06-10 MED ORDER — POLYETHYLENE GLYCOL 3350 17 GM/SCOOP PO POWD: 17.0000 g | Freq: Every day | ORAL | 0 refills | Status: AC

## 2024-06-10 NOTE — ED Notes (Signed)
 Patient is being discharged from the Urgent Care and sent to the Emergency Department via POV . Per Rumaldo Ryder, NP, patient is in need of higher level of care due to abdominal pain and blood in stool.. Patient is aware and verbalizes understanding of plan of care.  Vitals:   06/10/24 1846  BP: (!) 142/87  Pulse: 80  Resp: 14  Temp: 98.4 F (36.9 C)  SpO2: 98%

## 2024-06-10 NOTE — ED Triage Notes (Signed)
 Per interpreter-Shimaa [id number]  Patient reports that he began having abdominal pain then had a BM with a large amount of bright red blood in the stool with a small clot x 2 episodes. Patient states he has had colon surgery due to obstruction in the past.  Patient has not had taken any medications for his symptoms.

## 2024-06-10 NOTE — ED Provider Notes (Signed)
 " [facility]    CSN: [id number] Arrival date & time: 06/10/24  1823      History   Chief Complaint Chief Complaint  Patient presents with   Blood In Stools    HPI Todd Jordan is a 29 y.o. male.   Patient presents with abdominal pain and blood in stools that began 2 days ago.  Patient reports that he has had 3 bowel movements over the last 2 days with large amounts of blood including clots.  Patient reports the pain mainly occurs when he is having a bowel movement or has the urge to have a bowel movement.  Patient also endorses some mild nausea.  Denies vomiting, diarrhea, or fever.  Patient does have a history of a sigmoid volvulus and did have a partial colectomy with colostomy placement for this.  Patient had colostomy removed in May 2024 and has not seen gastroenterology or surgery since then.  Patient also denies any abdominal pain or bloody stools since then until now.  The history is provided by the patient and medical records. The history is limited by a language barrier. A language interpreter was used (Arabic interpreter, patient's visitor also speaks English).    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    Past Surgical History:  Procedure Laterality Date   ANAL RECTAL MANOMETRY N/A 07/01/2022   Procedure: ANO RECTAL MANOMETRY;  Surgeon: Teresa Lonni HERO, MD;  Location: [facility];  Service: General;  Laterality: N/A;   ANAL RECTAL MANOMETRY N/A 07/08/2022   Procedure: PLEAS RECTAL MANOMETRY;  Surgeon: Teresa Lonni HERO, MD;  Location: [facility];  Service: General;  Laterality: N/A;   COLON RESECTION SIGMOID N/A 05/13/2020    Procedure: COLON RESECTION SIGMOID;  Surgeon: Paola Dreama SAILOR, MD;  Location: [facility];  Service: General;  Laterality: N/A;   FLEXIBLE SIGMOIDOSCOPY N/A 10/01/2022   Procedure: FLEXIBLE SIGMOIDOSCOPY;  Surgeon: Teresa Lonni HERO, MD;  Location: [facility];  Service: General;  Laterality: N/A;   LAPAROTOMY N/A 05/13/2020   Procedure: EXPLORATORY LAPAROTOMY MOTION OF SPLENIC FLEXTION;  Surgeon: Paola Dreama SAILOR, MD;  Location: [facility];  Service: General;  Laterality: N/A;   LYSIS OF ADHESION N/A 10/01/2022   Procedure: LYSIS OF ADHESION;  Surgeon: Teresa Lonni HERO, MD;  Location: [facility];  Service: General;  Laterality: N/A;   OSTOMY N/A 05/13/2020   Procedure: OSTOMY;  Surgeon: Paola Dreama SAILOR, MD;  Location: [facility];  Service: General;  Laterality: N/A;   XI ROBOTIC ASSISTED COLOSTOMY TAKEDOWN N/A 10/01/2022   Procedure: ROBOTIC COLOSTOMY TAKEDOWN, BILATERAL TAP BLOCK, TISSUE PERFUSSION ASSESMENT VIA FIREFLY INJECTION, MOBILIZATION OF SPLENIC FLEXURE;  Surgeon: Teresa Lonni HERO, MD;  Location: [facility];  Service: General;  Laterality: N/A;       Home Medications    Prior to Admission medications  Not on File    Family History History reviewed. No pertinent family history.  Social History Social History[1]   Allergies   Patient has no known allergies.   Review of Systems Review of Systems  Per HPI  Physical Exam Triage Vital Signs ED Triage Vitals  Encounter Vitals Group     BP 06/10/24 1846 ROLLEN)  142/87     Girls Systolic BP Percentile --      Girls Diastolic BP Percentile --      Boys Systolic BP Percentile --      Boys Diastolic BP Percentile --      Pulse Rate 06/10/24 1846 80     Resp 06/10/24 1846 14     Temp 06/10/24 1846 98.4 F (36.9 C)     Temp Source 06/10/24 1846 Oral     SpO2 06/10/24 1846 98 %     Weight --      Height --      Head Circumference --      Peak Flow --      Pain Score 06/10/24 1851 0     Pain Loc --      Pain Education --      Exclude  from Growth Chart --    No data found.  Updated Vital Signs BP (!) 142/87 (BP Location: Left Arm)   Pulse 80   Temp 98.4 F (36.9 C) (Oral)   Resp 14   SpO2 98%   Visual Acuity Right Eye Distance:   Left Eye Distance:   Bilateral Distance:    Right Eye Near:   Left Eye Near:    Bilateral Near:     Physical Exam Vitals and nursing note reviewed.  Constitutional:      General: He is awake. He is not in acute distress.    Appearance: Normal appearance. He is well-developed and well-groomed. He is not ill-appearing.  Abdominal:     General: Abdomen is flat. A surgical scar is present. Bowel sounds are normal.     Palpations: Abdomen is soft.     Tenderness: There is no abdominal tenderness.  Skin:    General: Skin is warm and dry.  Neurological:     General: No focal deficit present.     Mental Status: He is alert and oriented to person, place, and time. Mental status is at baseline.  Psychiatric:        Behavior: Behavior is cooperative.      UC Treatments / Results  Labs (all labs ordered are listed, but only abnormal results are displayed) Labs Reviewed - No data to display  EKG   Radiology No results found.  Procedures Procedures (including critical care time)  Medications Ordered in UC Medications - No data to display  Initial Impression / Assessment and Plan / UC Course  I have reviewed the triage vital signs and the nursing notes.  Pertinent labs & imaging results that were available during my care of the patient were reviewed by me and considered in my medical decision making (see chart for details).     Patient is overall well-appearing.  Vitals are stable.  Without tenderness appreciated on exam.  Abdomen is flat and soft and bowel sounds are normal at this time.  Recommended patient be seen in the emergency department for further evaluation due to significant amount of bleeding with bowel movements and previous complex colon history.  Patient is  understanding and agreeable to plan at this time.  Patient is stable to arrive to the ER via POV with visitor. Final Clinical Impressions(s) / UC Diagnoses   Final diagnoses:  Generalized abdominal pain  Blood in stool     Discharge Instructions      Please go to the emergency room for further evaluation.   ED Prescriptions   None    PDMP not reviewed this encounter.    [  1]  Social History Tobacco Use   Smoking status: Never   Smokeless tobacco: Never  Vaping Use   Vaping status: Never Used  Substance Use Topics   Alcohol use: Never   Drug use: Never     Johnie Rumaldo LABOR, NP 06/10/24 1911  "

## 2024-06-10 NOTE — ED Triage Notes (Signed)
 Pt sent by UC for bright red blood in stool today. Pt c./o pain in rectum w/Bmx2d. Pt c/o dizziness. PT denies weakness.

## 2024-06-10 NOTE — Discharge Instructions (Signed)
 Please go to the emergency room for further evaluation.

## 2024-06-10 NOTE — ED Provider Notes (Addendum)
 " [place] [facility] Provider Note   CSN: [id number] Arrival date & time: 06/10/24  1924     Patient presents with: Rectal Bleeding   Todd Jordan is a 29 y.o. male.  Presents today from urgent care for bright red blood in his stool today.  Patient also reports rectal pain with bowel movements x 2 days and dizziness.  Patient denies abdominal pain, fever, chills, nausea, vomiting, weakness, chest pain, shortness of breath, any other complaints at this time.  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   HPI     Prior to Admission medications  Not on File    Allergies: Patient has no known allergies.    Review of Systems  Gastrointestinal:  Positive for blood in stool and rectal pain.    Updated Vital Signs BP (!) 151/89   Pulse 87   Temp 98.4 F (36.9 C) (Oral)   Resp 18   SpO2 100%   Physical Exam Vitals and nursing note reviewed. Exam conducted with a chaperone present.  Constitutional:      General: He is not in acute distress.    Appearance: He is well-developed. He is not toxic-appearing.  HENT:     Head: Normocephalic and atraumatic.     Right Ear: External ear normal.     Left Ear: External ear normal.     Nose: Nose normal.  Eyes:     Extraocular Movements: Extraocular movements intact.     Conjunctiva/sclera: Conjunctivae normal.  Cardiovascular:     Rate and Rhythm: Normal rate and regular rhythm.     Pulses: Normal pulses.     Heart sounds: Normal heart sounds. No murmur heard. Pulmonary:     Effort: Pulmonary effort is normal. No respiratory distress.     Breath sounds: Normal breath sounds.  Abdominal:     General: There is no distension.     Palpations: Abdomen is soft.     Tenderness: There is no abdominal tenderness.  Genitourinary:    Rectum: Guaiac result positive. No mass, anal fissure, external hemorrhoid or internal hemorrhoid. Normal anal tone.   Musculoskeletal:        General: No swelling.     Cervical back: Neck supple.  Skin:    General: Skin is warm and dry.     Capillary Refill: Capillary refill takes less than 2 seconds.  Neurological:     General: No focal deficit present.     Mental Status: He is alert and oriented to person, place, and time.  Psychiatric:        Mood and Affect: Mood normal.     (all labs ordered are listed, but only abnormal results are displayed) Labs Reviewed  COMPREHENSIVE METABOLIC PANEL WITH GFR - Abnormal; Notable for the following components:      Result Value   Glucose, Bld 116 (*)    All other components within normal limits  POC OCCULT BLOOD, ED - Abnormal; Notable for the following components:   Fecal Occult Bld POSITIVE (*)    All other components within normal limits  CBC WITH DIFFERENTIAL/PLATELET  PROTIME-INR  TYPE AND SCREEN    EKG: None  Radiology: No results found.   Procedures   Medications Ordered in the ED - No data to display  Medical Decision Making Amount and/or Complexity of Data Reviewed Labs: ordered.   This patient presents to the ED for concern of rectal pain and bleeding differential diagnosis includes anal fissure, lower GI bleed, hemorrhoid, ulcerative colitis, diverticulitis    Additional history obtained Additional history obtained from Electronic Medical Record External records from outside source obtained and reviewed including Care Everywhere and admission notes  Lab Tests:  I Ordered, and personally interpreted labs.  The pertinent results include: CBC unremarkable and hemoglobin stable at 15.6, Hemoccult positive, CMP unremarkable, pro time and INR WNL  Problem List / ED Course:  Considered for admission or further workup however patient's vital signs, physical exam, and labs are reassuring.  Patient advised to do sitz bath's and take MiraLAX  if he is straining or having hard stools.  Patient also  advised to follow-up with [facility] gastroenterology.  Patient given return precautions.  I feel patient is safe for discharge at this time.     Final diagnoses:  Rectal bleeding    ED Discharge Orders     None          Francis Ileana SAILOR, PA-C 06/10/24 2036    Francis Ileana SAILOR, PA-C 06/10/24 2037    Doretha Folks, MD 06/10/24 2050  "

## 2024-06-10 NOTE — ED Notes (Signed)
I  used interpreter to triage pt

## 2024-06-10 NOTE — Discharge Instructions (Addendum)
 Today you were seen for rectal bleeding.  Your workup on the emergency department was reassuring.  You may do sitz bath's as instructed.  If you feel like you are straining or having hard stools you may take MiraLAX  daily.  Please follow-up with [place] gastroenterology for further evaluation and workup.  Thank you for letting us  treat you today. After reviewing your labs, I feel you are safe to go home. Please follow up with your PCP in the next several days and provide them with your records from this visit. Return to the Emergency Room if pain becomes severe or symptoms worsen.
